# Patient Record
Sex: Female | Born: 1985 | Race: Black or African American | Hispanic: No | Marital: Single | Smoking: Never smoker
Health system: Southern US, Community
[De-identification: ages and names within clinical notes are randomized; demographics above are authoritative.]

## PROBLEM LIST (undated history)

## (undated) DIAGNOSIS — D649 Anemia, unspecified: Secondary | ICD-10-CM

---

## 2014-10-18 ENCOUNTER — Encounter (HOSPITAL_COMMUNITY): Payer: Self-pay | Admitting: Emergency Medicine

## 2014-10-18 ENCOUNTER — Emergency Department (HOSPITAL_COMMUNITY): Payer: Self-pay

## 2014-10-18 ENCOUNTER — Emergency Department (HOSPITAL_COMMUNITY)
Admission: EM | Admit: 2014-10-18 | Discharge: 2014-10-18 | Disposition: A | Discharge status: Home / Self Care | Payer: Self-pay | Attending: Emergency Medicine | Admitting: Emergency Medicine

## 2014-10-18 DIAGNOSIS — S0264XA Fracture of ramus of mandible, initial encounter for closed fracture: Secondary | ICD-10-CM | POA: Insufficient documentation

## 2014-10-18 DIAGNOSIS — Y9289 Other specified places as the place of occurrence of the external cause: Secondary | ICD-10-CM | POA: Insufficient documentation

## 2014-10-18 DIAGNOSIS — S02609A Fracture of mandible, unspecified, initial encounter for closed fracture: Secondary | ICD-10-CM

## 2014-10-18 DIAGNOSIS — Y998 Other external cause status: Secondary | ICD-10-CM | POA: Insufficient documentation

## 2014-10-18 DIAGNOSIS — M7981 Nontraumatic hematoma of soft tissue: Secondary | ICD-10-CM | POA: Insufficient documentation

## 2014-10-18 DIAGNOSIS — R21 Rash and other nonspecific skin eruption: Secondary | ICD-10-CM | POA: Insufficient documentation

## 2014-10-18 DIAGNOSIS — Y9389 Activity, other specified: Secondary | ICD-10-CM | POA: Insufficient documentation

## 2014-10-18 LAB — CBC
HCT: 37.4 % (ref 36.0–46.0)
Hemoglobin: 12.4 g/dL (ref 12.0–15.0)
MCH: 29.6 pg (ref 26.0–34.0)
MCHC: 33.2 g/dL (ref 30.0–36.0)
MCV: 89.3 fL (ref 78.0–100.0)
Platelets: 305 10*3/uL (ref 150–400)
RBC: 4.19 MIL/uL (ref 3.87–5.11)
RDW: 13.4 % (ref 11.5–15.5)
WBC: 6.5 10*3/uL (ref 4.0–10.5)

## 2014-10-18 MED ORDER — HYDROCODONE-ACETAMINOPHEN 5-325 MG PO TABS: 1.0000 | ORAL_TABLET | ORAL | Status: DC | PRN
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
## 2014-10-18 NOTE — Discharge Instructions (Signed)
Fractured-Jaw Meal Plan °The purpose of the fractured-jaw meal plan is to provide foods that can be easily blended and easily swallowed. This plan is typically used after jaw or mouth surgery, wired jaw surgery, or dental surgery. °Foods in this plan need to be blended so that they can be sipped from a straw or given through a syringe. You should try to have at least three meals and three snacks daily. It is important to make sure you get enough calories and protein to prevent weight loss and help your body heal, especially after surgery. You may wish to include a liquid multivitamin in your plan to ensure that you get all the vitamins and minerals you need. Ask your health care provider for a recommendation.  °HOW DO I PREPARE MY MEALS? °All foods in this plan must be blended. Avoid nuts, seeds, skins, peels, bones, or any foods that cannot be blended to the right consistency. Make sure to eat a variety of foods from each food group every day. The following tips can help you as you blend your food: °· Remove skins, seeds, and peels from food. °· Cook meats and vegetables thoroughly. °· Cut foods into small pieces and mix with a small amount of liquid in a food processor or blender. Continue to add liquid until the food becomes thin enough to sip through a straw. °· Adding liquids such as juice, milk, cream, broth, gravy, or vegetable juice can help add flavor to foods. °· Heat foods after they have been blended to reduce the amount of foam created from blending. °· Heat or cool your foods to lukewarm temperatures if your teeth and mouth are sensitive to extreme temperatures. °WHAT FOODS CAN I EAT? °Make sure to eat a variety of foods from each food group.  °Grains °· Hot cereals, such as oatmeal, grits, ground wheat cereals, and polenta. °· Rice and pasta. °· Couscous. °Vegetables °· All cooked or canned vegetables, without seeds and skins. °· Vegetable juices. °· Cooked potatoes, without skins. °Fruit °· Any  cooked or canned fruits, without seeds and skins. °· Fresh, peeled soft fruits, such as bananas and peaches, that can be blended until smooth. °· All fruit juices, without seeds and skins. °Meat and Other Protein Sources °· Soft-boiled eggs, scrambled eggs, powdered eggs, pasteurized egg mixtures, and custard. °· Ground meats, such as hamburger, turkey, sausage, and meatloaf. °· Tender, well-cooked meat, poultry, and fish prepared without bones or skin. °· Soft soy foods (such as tofu). °· Smooth nut butters. °Dairy °· All are allowed. °Beverages °· Coffee (regular or decaffeinated), tea, and mineral water. °Condiments °· All seasonings and condiments that blend well. °WHEN MAY I NEED TO SUPPLEMENT MY MEALS? °If you begin to lose weight on this plan, you may need to increase the amount of food you are eating or the number of calories in your food or both. You can increase the number of calories by adding any of the following foods: °· Protein powder or powdered milk. °· Extra fats, such as margarine (without trans fat), sour cream, cream cheese, cream, and nut butters, such as peanut butter or almond butter. °· Sweets, such as honey, ice cream, blackstrap molasses, or sugar. °Document Released: 12/23/2009 Document Revised: 11/19/2013 Document Reviewed: 06/01/2013 °ExitCare® Patient Information ©2015 [employer]. This information is not intended to replace advice given to you by your health care provider. Make sure you discuss any questions you have with your health care provider. ° °

## 2015-10-02 ENCOUNTER — Emergency Department (HOSPITAL_COMMUNITY)
Admission: EM | Admit: 2015-10-02 | Discharge: 2015-10-02 | Disposition: A | Discharge status: Home / Self Care | Payer: Self-pay | Attending: Emergency Medicine | Admitting: Emergency Medicine

## 2015-10-02 ENCOUNTER — Encounter (HOSPITAL_COMMUNITY): Payer: Self-pay | Admitting: Emergency Medicine

## 2015-10-02 DIAGNOSIS — Y9289 Other specified places as the place of occurrence of the external cause: Secondary | ICD-10-CM | POA: Insufficient documentation

## 2015-10-02 DIAGNOSIS — Z23 Encounter for immunization: Secondary | ICD-10-CM | POA: Insufficient documentation

## 2015-10-02 DIAGNOSIS — S61011A Laceration without foreign body of right thumb without damage to nail, initial encounter: Secondary | ICD-10-CM | POA: Insufficient documentation

## 2015-10-02 DIAGNOSIS — Y998 Other external cause status: Secondary | ICD-10-CM | POA: Insufficient documentation

## 2015-10-02 DIAGNOSIS — Y9389 Activity, other specified: Secondary | ICD-10-CM | POA: Insufficient documentation

## 2015-10-02 DIAGNOSIS — W268XXA Contact with other sharp object(s), not elsewhere classified, initial encounter: Secondary | ICD-10-CM | POA: Insufficient documentation

## 2015-10-02 MED ORDER — LIDOCAINE HCL 2 % IJ SOLN
10.0000 mL | Freq: Once | INTRAMUSCULAR | Status: AC
  Administered 2015-10-02: 400 mg via INTRADERMAL
  Filled 2015-10-02: qty 20

## 2015-10-02 MED ORDER — SODIUM BICARBONATE 4 % IV SOLN
5.0000 mL | Freq: Once | INTRAVENOUS | Status: AC
  Administered 2015-10-02: 5 mL via INTRAVENOUS
  Filled 2015-10-02: qty 5

## 2015-10-02 MED ORDER — TETANUS-DIPHTH-ACELL PERTUSSIS 5-2.5-18.5 LF-MCG/0.5 IM SUSP
0.5000 mL | Freq: Once | INTRAMUSCULAR | Status: AC
  Administered 2015-10-02: 0.5 mL via INTRAMUSCULAR
  Filled 2015-10-02: qty 0.5

## 2015-10-02 MED ORDER — IBUPROFEN 200 MG PO TABS
400.0000 mg | ORAL_TABLET | Freq: Once | ORAL | Status: AC
  Administered 2015-10-02: 400 mg via ORAL
  Filled 2015-10-02: qty 2

## 2015-10-02 NOTE — Discharge Instructions (Signed)
Bacitracin twice a day. Ibuprofen or tylenol for pain. Keep clean. Wash with soap and water. Suture removal in 7 days. Follow up with primary care doctor or urgent care.    Laceration Care, Adult A laceration is a cut that goes through all of the layers of the skin and into the tissue that is right under the skin. Some lacerations heal on their own. Others need to be closed with stitches (sutures), staples, skin adhesive strips, or skin glue. Proper laceration care minimizes the risk of infection and helps the laceration to heal better. HOW TO CARE FOR YOUR LACERATION If sutures or staples were used:  Keep the wound clean and dry.  If you were given a bandage (dressing), you should change it at least one time per day or as told by your health care provider. You should also change it if it becomes wet or dirty.  Keep the wound completely dry for the first 24 hours or as told by your health care provider. After that time, you may shower or bathe. However, make sure that the wound is not soaked in water until after the sutures or staples have been removed.  Clean the wound one time each day or as told by your health care provider:  Wash the wound with soap and water.  Rinse the wound with water to remove all soap.  Pat the wound dry with a clean towel. Do not rub the wound.  After cleaning the wound, apply a thin layer of antibiotic ointmentas told by your health care provider. This will help to prevent infection and keep the dressing from sticking to the wound.  Have the sutures or staples removed as told by your health care provider. If skin adhesive strips were used:  Keep the wound clean and dry.  If you were given a bandage (dressing), you should change it at least one time per day or as told by your health care provider. You should also change it if it becomes dirty or wet.  Do not get the skin adhesive strips wet. You may shower or bathe, but be careful to keep the wound  dry.  If the wound gets wet, pat it dry with a clean towel. Do not rub the wound.  Skin adhesive strips fall off on their own. You may trim the strips as the wound heals. Do not remove skin adhesive strips that are still stuck to the wound. They will fall off in time. If skin glue was used:  Try to keep the wound dry, but you may briefly wet it in the shower or bath. Do not soak the wound in water, such as by swimming.  After you have showered or bathed, gently pat the wound dry with a clean towel. Do not rub the wound.  Do not do any activities that will make you sweat heavily until the skin glue has fallen off on its own.  Do not apply liquid, cream, or ointment medicine to the wound while the skin glue is in place. Using those may loosen the film before the wound has healed.  If you were given a bandage (dressing), you should change it at least one time per day or as told by your health care provider. You should also change it if it becomes dirty or wet.  If a dressing is placed over the wound, be careful not to apply tape directly over the skin glue. Doing that may cause the glue to be pulled off before the wound  has healed.  Do not pick at the glue. The skin glue usually remains in place for 5-10 days, then it falls off of the skin. General Instructions  Take over-the-counter and prescription medicines only as told by your health care provider.  If you were prescribed an antibiotic medicine or ointment, take or apply it as told by your doctor. Do not stop using it even if your condition improves.  To help prevent scarring, make sure to cover your wound with sunscreen whenever you are outside after stitches are removed, after adhesive strips are removed, or when glue remains in place and the wound is healed. Make sure to wear a sunscreen of at least 30 SPF.  Do not scratch or pick at the wound.  Keep all follow-up visits as told by your health care provider. This is  important.  Check your wound every day for signs of infection. Watch for:  Redness, swelling, or pain.  Fluid, blood, or pus.  Raise (elevate) the injured area above the level of your heart while you are sitting or lying down, if possible. SEEK MEDICAL CARE IF:  You received a tetanus shot and you have swelling, severe pain, redness, or bleeding at the injection site.  You have a fever.  A wound that was closed breaks open.  You notice a bad smell coming from your wound or your dressing.  You notice something coming out of the wound, such as wood or glass.  Your pain is not controlled with medicine.  You have increased redness, swelling, or pain at the site of your wound.  You have fluid, blood, or pus coming from your wound.  You notice a change in the color of your skin near your wound.  You need to change the dressing frequently due to fluid, blood, or pus draining from the wound.  You develop a new rash.  You develop numbness around the wound. SEEK IMMEDIATE MEDICAL CARE IF:  You develop severe swelling around the wound.  Your pain suddenly increases and is severe.  You develop painful lumps near the wound or on skin that is anywhere on your body.  You have a red streak going away from your wound.  The wound is on your hand or foot and you cannot properly move a finger or toe.  The wound is on your hand or foot and you notice that your fingers or toes look pale or bluish.   This information is not intended to replace advice given to you by your health care provider. Make sure you discuss any questions you have with your health care provider.   Document Released: 07/05/2005 Document Revised: 11/19/2014 Document Reviewed: 07/01/2014 Elsevier Interactive Patient Education [employer]

## 2015-10-02 NOTE — ED Notes (Signed)
Patient presents with 1/2" laceration to tip of right thumb, reports she cut it trying to cut potatoes, appears to be piece of nailbed in laceration. Minimal swelling noted, no obvious deformity.

## 2015-10-02 NOTE — ED Provider Notes (Signed)
History  By signing my name below, I, Karle Plumber, attest that this documentation has been prepared under the direction and in the presence of Enya Bureau, PA-C. Electronically Signed: Karle Plumber, ED Scribe. 10/02/2015. 11:14 PM.  Chief Complaint  Patient presents with  . Laceration   The history is provided by the patient and medical records. No language interpreter was used.    HPI Comments:  Lindsey Zamora is a 30 y.o. female who presents to the Emergency Department complaining of a laceration to the right distal thumb that occurred PTA. Pt states she was using a potato cutting device without the guard. She reports moderate pain and associated bleeding that is now controlled. She denies modifying factors. She denies numbness, tingling or weakness of the right thumb or hand, fever, chills, nausea or vomiting. She states her tetanus vaccination is not UTD.  History reviewed. No pertinent past medical history. History reviewed. No pertinent past surgical history. No family history on file. Social History  Substance Use Topics  . Smoking status: Never Smoker   . Smokeless tobacco: None  . Alcohol Use: No   OB History    No data available     Review of Systems  Constitutional: Negative for fever and chills.  Skin: Positive for wound.  Neurological: Negative for weakness and numbness.    Allergies  Review of patient's allergies indicates no known allergies.  Home Medications   Prior to Admission medications   Medication Sig Start Date End Date Taking? Authorizing Provider  HYDROcodone-acetaminophen (NORCO/VICODIN) 5-325 MG per tablet Take 1-2 tablets by mouth every 4 (four) hours as needed. 10/18/14   Elpidio Anis, PA-C   Triage Vitals: BP 128/96 mmHg  Pulse 68  Temp(Src) 98 F (36.7 C) (Oral)  Resp 15  Ht  (1.6 m)  Wt 173 lb (78.472 kg)  BMI 30.65 kg/m2  SpO2 100%  LMP 09/11/2015 Physical Exam  Constitutional: She is oriented to person, place,  and time. She appears well-developed and well-nourished.  HENT:  Head: Normocephalic and atraumatic.  Eyes: EOM are normal.  Neck: Normal range of motion.  Cardiovascular: Normal rate.   Pulmonary/Chest: Effort normal.  Musculoskeletal: Normal range of motion.  1 cm flap laceration to the right distal tip of the thumb. Less than 1 mm of the fingernail cut off of the tip as well. No nailbed involvement. Full range of motion all joints. Laceration is superficial.  Neurological: She is alert and oriented to person, place, and time.  Skin: Skin is warm and dry.  Psychiatric: She has a normal mood and affect. Her behavior is normal.  Nursing note and vitals reviewed.   ED Course  Procedures (including critical care time) DIAGNOSTIC STUDIES: Oxygen Saturation is 100% on RA, normal by my interpretation.   COORDINATION OF CARE: 10:33 PM- Will suture laceration. Pt verbalizes understanding and agrees to plan.  NERVE BLOCK Performed by: Jaynie Crumble A Consent: Verbal consent obtained. Required items: required blood products, implants, devices, and special equipment available Time out: Immediately prior to procedure a "time out" was called to verify the correct patient, procedure, equipment, support staff and site/side marked as required.  Indication: laceration of finger Nerve block body site: right thumb  Preparation: Patient was prepped and draped in the usual sterile fashion. Needle gauge: 24 G Location technique: anatomical landmarks  Local anesthetic: lidocaine wo epi 2%   Anesthetic total: 3 ml  Outcome: pain improved Patient tolerance: Patient tolerated the procedure well with no immediate complications.  LACERATION REPAIR PROCEDURE NOTE The patient's identification was confirmed and consent was obtained. This procedure was performed by Jaynie Crumbleatyana Nitzia Perren, PA-C at 10:58 PM. Site: right distal thumb Sterile procedures observed Anesthetic used (type and amt):  Lidocaine 2% without Epinephrine digital block Suture type/size: 5-0 Prolene Length: 1 cm # of Sutures: 3 Technique: simple, interrupted Complexity: simple Antibx ointment applied Tetanus ordered Site anesthetized, irrigated with NS, explored without evidence of foreign body, wound well approximated, site covered with dry, sterile dressing.  Patient tolerated procedure well without complications. Instructions for care discussed verbally and patient provided with additional written instructions for homecare and f/u.  Medications  ibuprofen (ADVIL,MOTRIN) tablet 400 mg (400 mg Oral Given 10/02/15 2148)  lidocaine (XYLOCAINE) 2 % (with pres) injection 200 mg (400 mg Intradermal Given 10/02/15 2241)  sodium bicarbonate (NEUT) 4 % injection 5 mL (5 mLs Intravenous Given 10/02/15 2250)    MDM   Final diagnoses:  Laceration of right thumb, initial encounter   Patient with a small flap laceration to the distal right thumb. Laceration repaired with sutures. Tetanus updated. Laceration is superficial, doubt any bony involvement. There is no nailbed laceration. Home with wound care, and present Tylenol for pain, follow-up and suture removal.  Filed Vitals:   10/02/15 2132  BP: 128/96  Pulse: 68  Temp: 98 F (36.7 C)  TempSrc: Oral  Resp: 15  Height: 5\' 3"  (1.6 m)  Weight: 78.472 kg  SpO2: 100%    I personally performed the services described in this documentation, which was scribed in my presence. The recorded information has been reviewed and is accurate.   Jaynie Crumbleatyana Janell Keeling, PA-C 10/02/15 2333  Mancel BaleElliott Wentz, MD 10/03/15 0010

## 2017-05-25 DIAGNOSIS — J209 Acute bronchitis, unspecified: Secondary | ICD-10-CM | POA: Diagnosis not present

## 2017-05-25 DIAGNOSIS — R062 Wheezing: Secondary | ICD-10-CM | POA: Diagnosis not present

## 2017-07-16 ENCOUNTER — Other Ambulatory Visit: Payer: Self-pay

## 2017-07-16 ENCOUNTER — Emergency Department (HOSPITAL_COMMUNITY): Payer: BLUE CROSS/BLUE SHIELD

## 2017-07-16 ENCOUNTER — Emergency Department (HOSPITAL_COMMUNITY)
Admission: EM | Admit: 2017-07-16 | Discharge: 2017-07-16 | Disposition: A | Discharge status: Home / Self Care | Payer: BLUE CROSS/BLUE SHIELD | Attending: Emergency Medicine | Admitting: Emergency Medicine

## 2017-07-16 DIAGNOSIS — N83201 Unspecified ovarian cyst, right side: Secondary | ICD-10-CM | POA: Diagnosis not present

## 2017-07-16 DIAGNOSIS — B9689 Other specified bacterial agents as the cause of diseases classified elsewhere: Secondary | ICD-10-CM | POA: Diagnosis not present

## 2017-07-16 DIAGNOSIS — N898 Other specified noninflammatory disorders of vagina: Secondary | ICD-10-CM | POA: Diagnosis not present

## 2017-07-16 DIAGNOSIS — R102 Pelvic and perineal pain: Secondary | ICD-10-CM | POA: Insufficient documentation

## 2017-07-16 DIAGNOSIS — R109 Unspecified abdominal pain: Secondary | ICD-10-CM | POA: Diagnosis not present

## 2017-07-16 DIAGNOSIS — Z79899 Other long term (current) drug therapy: Secondary | ICD-10-CM | POA: Diagnosis not present

## 2017-07-16 DIAGNOSIS — N760 Acute vaginitis: Secondary | ICD-10-CM

## 2017-07-16 LAB — BASIC METABOLIC PANEL
Anion gap: 6 (ref 5–15)
BUN: 10 mg/dL (ref 6–20)
CHLORIDE: 107 mmol/L (ref 101–111)
CO2: 24 mmol/L (ref 22–32)
Calcium: 8.7 mg/dL — ABNORMAL LOW (ref 8.9–10.3)
Creatinine, Ser: 0.85 mg/dL (ref 0.44–1.00)
GFR calc non Af Amer: >60 mL/min (ref >60)
Glucose, Bld: 83 mg/dL (ref 65–99)
POTASSIUM: 3.6 mmol/L (ref 3.5–5.1)
Sodium: 137 mmol/L (ref 135–145)

## 2017-07-16 LAB — URINALYSIS, ROUTINE W REFLEX MICROSCOPIC
Bilirubin Urine: NEGATIVE
Glucose, UA: NEGATIVE mg/dL
KETONES UR: NEGATIVE mg/dL
Leukocytes, UA: NEGATIVE
Nitrite: NEGATIVE
Protein, ur: NEGATIVE mg/dL
Specific Gravity, Urine: 1.025 (ref 1.005–1.030)
pH: 5 (ref 5.0–8.0)

## 2017-07-16 LAB — CBC WITH DIFFERENTIAL/PLATELET
Basophils Absolute: 0 10*3/uL (ref 0.0–0.1)
Basophils Relative: 1 %
EOS ABS: 0.2 10*3/uL (ref 0.0–0.7)
Eosinophils Relative: 4 %
HCT: 33.6 % — ABNORMAL LOW (ref 36.0–46.0)
HEMOGLOBIN: 11 g/dL — AB (ref 12.0–15.0)
Lymphocytes Relative: 39 %
Lymphs Abs: 2.5 10*3/uL (ref 0.7–4.0)
MCH: 27.4 pg (ref 26.0–34.0)
MCHC: 32.7 g/dL (ref 30.0–36.0)
MCV: 83.8 fL (ref 78.0–100.0)
Monocytes Absolute: 0.5 10*3/uL (ref 0.1–1.0)
Monocytes Relative: 8 %
Neutro Abs: 3.2 10*3/uL (ref 1.7–7.7)
Neutrophils Relative %: 48 %
Platelets: 393 10*3/uL (ref 150–400)
RBC: 4.01 MIL/uL (ref 3.87–5.11)
RDW: 14.6 % (ref 11.5–15.5)
WBC: 6.5 10*3/uL (ref 4.0–10.5)

## 2017-07-16 LAB — WET PREP, GENITAL
SPERM: NONE SEEN
TRICH WET PREP: NONE SEEN
YEAST WET PREP: NONE SEEN

## 2017-07-16 LAB — POC URINE PREG, ED: Preg Test, Ur: NEGATIVE

## 2017-07-16 MED ORDER — MELOXICAM 15 MG PO TABS: 15.0000 mg | ORAL_TABLET | Freq: Every day | ORAL | 0 refills | Status: DC

## 2017-07-16 MED ORDER — KETOROLAC TROMETHAMINE 60 MG/2ML IM SOLN
30.0000 mg | Freq: Once | INTRAMUSCULAR | Status: DC
  Filled 2017-07-16: qty 2

## 2017-07-16 MED ORDER — METRONIDAZOLE 500 MG PO TABS: 500.0000 mg | ORAL_TABLET | Freq: Two times a day (BID) | ORAL | 0 refills | Status: DC

## 2017-07-16 NOTE — ED Notes (Signed)
Pt very angry at being here as long as she has. Pt states "Somebody needs to figure something out right now. I need to know what is going on. There is no reason I should still be here." RN and PA have explained delay to pt, pt still angry.

## 2017-07-16 NOTE — ED Triage Notes (Signed)
Pt reports pain in her lower stomach and running down her legs. Pt reports some pain in her lower back but the pain in both legs is the same and it runs down to her knees.  Pt denies any hx of sciatica.  Pt reports that the pain starts in the morning and has to take ibuprofen for some pain relief. Pt reports the pain when she lays down the pain lightens. Pt works [employer] and reports she walks a lot.

## 2017-07-16 NOTE — ED Notes (Signed)
RN AT BEDSIDE COLLECTING LABS 

## 2017-07-16 NOTE — Discharge Instructions (Addendum)
DO NOT DRINK ALCOHOL WITH THIS MEDICATION AS IT CAN MAKE YOU VIOLENTLY ILL.!  YOUR STD STUDIES ARE PENDING.

## 2017-07-16 NOTE — ED Notes (Signed)
D/c instructions reviewed with patient. Pt did not sign d/c paperwork because she was understandably in a hurry to leave.

## 2017-07-16 NOTE — ED Provider Notes (Signed)
[facility]-EMERGENCY DEPT Provider Note   CSN: [id number] Arrival date & time: 07/16/17  1035     History   Chief Complaint No chief complaint on file.   HPI Lindsey Zamora is a 31 y.o. female who presents to the ED with abdominal pain that radiates to her legs. The pain started over a month ago and has gotten worse. The pain is located in the lower abdomen and today worse on the left lower abdomen. Patient describes the pain as cramping and like contractions. She has taken tylenol and ibuprofen that help for a short time but then the pain returns. Patient reports watery vaginal d/c. Last sexual intercourse 1 month ago. Patient does not use birth control.   The history is provided by the patient. No language interpreter was used.  Abdominal Pain   This is a new problem. The current episode started more than 1 week ago. The problem occurs constantly. The problem has been gradually worsening. The pain is located in the LLQ. The quality of the pain is cramping. The pain is at a severity of 10/10. Associated symptoms include headaches and myalgias. Pertinent negatives include fever, diarrhea, nausea, vomiting, constipation, dysuria and frequency. Nothing aggravates the symptoms. Nothing relieves the symptoms.    No past medical history on file.  There are no active problems to display for this patient.   No past surgical history on file.  OB History    No data available       Home Medications    Prior to Admission medications   Medication Sig Start Date End Date Taking? Authorizing Provider  acetaminophen (TYLENOL) 500 MG tablet Take 1,000 mg by mouth every 6 (six) hours as needed for mild pain.   Yes [provider]  ibuprofen (ADVIL,MOTRIN) 400 MG tablet Take 400 mg by mouth every 6 (six) hours as needed for headache.   Yes [provider]  HYDROcodone-acetaminophen (NORCO/VICODIN) 5-325 MG per tablet Take 1-2 tablets by mouth every 4 (four)  hours as needed. Patient not taking: Reported on 07/16/2017 10/18/14   Elpidio AnisUpstill, Shari, PA-C    Family History No family history on file.  Social History Social History   Tobacco Use  . Smoking status: Never Smoker  Substance Use Topics  . Alcohol use: No  . Drug use: No     Allergies   Patient has no known allergies.   Review of Systems Review of Systems  Constitutional: Negative for fever.  HENT: Negative.   Eyes: Negative for discharge, redness and itching.  Respiratory: Negative for cough and shortness of breath.   Cardiovascular: Negative for chest pain.  Gastrointestinal: Positive for abdominal pain. Negative for constipation, diarrhea, nausea and vomiting.  Genitourinary: Positive for pelvic pain and vaginal discharge. Negative for difficulty urinating, dysuria, frequency and urgency.  Musculoskeletal: Positive for back pain and myalgias.  Skin: Negative for rash.  Neurological: Positive for headaches. Negative for syncope.  Psychiatric/Behavioral: Negative for confusion.     Physical Exam Updated Vital Signs BP (!) 153/98 (BP Location: Left Arm)   Pulse 73   Temp 98.5 F (36.9 C) (Oral)   Resp 18   Ht 5\' 1"  (1.549 m)   Wt 73.5 kg (162 lb)   LMP 07/02/2017 (Exact Date)   SpO2 100%   BMI 30.61 kg/m   Physical Exam  Constitutional: She appears well-developed and well-nourished. No distress.  HENT:  Head: Normocephalic and atraumatic.  Right Ear: Tympanic membrane normal.  Left Ear: Tympanic membrane  normal.  Nose: Nose normal.  Mouth/Throat: Uvula is midline, oropharynx is clear and moist and mucous membranes are normal.  Eyes: EOM are normal.  Neck: Neck supple.  Cardiovascular: Normal rate and regular rhythm.  Pulmonary/Chest: Effort normal.  Abdominal: Soft. There is tenderness.  Genitourinary:  Genitourinary Comments: External genitalia without lesions, watery d/c vaginal vault. Positive CMT, bilateral adnexal tenderness, uterus without palpable  enlargement.  Musculoskeletal: Normal range of motion. She exhibits no edema.  No calf tenderness  Neurological: She is alert.  Skin: Skin is warm and dry.  Psychiatric: She has a normal mood and affect. Her behavior is normal.  Nursing note and vitals reviewed.    ED Treatments / Results  Labs (all labs ordered are listed, but only abnormal results are displayed) Labs Reviewed  WET PREP, GENITAL - Abnormal; Notable for the following components:      Result Value   Clue Cells Wet Prep HPF POC PRESENT (*)    WBC, Wet Prep HPF POC MANY (*)    All other components within normal limits  URINALYSIS, ROUTINE W REFLEX MICROSCOPIC - Abnormal; Notable for the following components:   Hgb urine dipstick SMALL (*)    Bacteria, UA RARE (*)    Squamous Epithelial / LPF 0-5 (*)    All other components within normal limits  CBC WITH DIFFERENTIAL/PLATELET - Abnormal; Notable for the following components:   Hemoglobin 11.0 (*)    HCT 33.6 (*)    All other components within normal limits  BASIC METABOLIC PANEL - Abnormal; Notable for the following components:   Calcium 8.7 (*)    All other components within normal limits  RPR  HIV ANTIBODY (ROUTINE TESTING)  POC URINE PREG, ED  GC/CHLAMYDIA PROBE AMP ([place]) NOT AT [facility]  Radiology No results found.  Procedures Procedures (including critical care time)  Medications Ordered in ED Medications  ketorolac (TORADOL) injection 30 mg (30 mg Intramuscular Refused 07/16/17 1625)     Initial Impression / Assessment and Plan / ED Course  I have reviewed the triage vital signs and the nursing notes. Patient awaiting ultrasound. Care turned over to A. Tiburcio PeaHarris, [facility] @ 4:54pm  Final Clinical Impressions(s) / ED Diagnoses   Final diagnoses:  Pelvic pain  Vaginal discharge    ED Discharge Orders    None       Kerrie Buffaloeese, Xsavier Seeley [place], [place] 07/16/17 1654    Pricilla LovelessGoldston, Scott, MD 07/16/17 1700

## 2017-07-16 NOTE — ED Provider Notes (Signed)
Patient with extensive wait in the ER.  Her ultrasound was mislabeled and I spent about 30 minutes and 4 phone calls to radiology to get this cleared up. Meanwhile the patient became irate, understandably. I tried to explain the situation, but was unable to calm her.  She appears to have an ovarian cyst + BV. Will treat for b/v, give antiinflammatories. Her std tests are pending.   Arthor CaptainHarris, Monque Haggar, PA-C 07/16/17 2306    Linwood DibblesKnapp, Jon, MD 07/16/17 [phone]

## 2017-07-17 LAB — HIV ANTIBODY (ROUTINE TESTING W REFLEX): HIV Screen 4th Generation wRfx: NONREACTIVE

## 2017-07-17 LAB — RPR: RPR Ser Ql: NONREACTIVE

## 2017-07-18 LAB — GC/CHLAMYDIA PROBE AMP (~~LOC~~) NOT AT ARMC
CHLAMYDIA, DNA PROBE: NEGATIVE
Chlamydia: NEGATIVE
NEISSERIA GONORRHEA: NEGATIVE
Neisseria Gonorrhea: NEGATIVE

## 2017-08-16 ENCOUNTER — Encounter: Payer: Self-pay | Admitting: Obstetrics and Gynecology

## 2017-08-16 ENCOUNTER — Encounter: Payer: Self-pay | Admitting: Advanced Practice Midwife

## 2017-08-16 ENCOUNTER — Ambulatory Visit (INDEPENDENT_AMBULATORY_CARE_PROVIDER_SITE_OTHER): Payer: BLUE CROSS/BLUE SHIELD | Admitting: Advanced Practice Midwife

## 2017-08-16 VITALS — BP 130/83 | HR 79 | Ht 61.0 in | Wt 167.7 lb

## 2017-08-16 DIAGNOSIS — N8003 Adenomyosis of the uterus: Secondary | ICD-10-CM

## 2017-08-16 DIAGNOSIS — R102 Pelvic and perineal pain: Secondary | ICD-10-CM | POA: Diagnosis not present

## 2017-08-16 DIAGNOSIS — N800 Endometriosis of uterus: Secondary | ICD-10-CM | POA: Diagnosis not present

## 2017-08-16 DIAGNOSIS — Z113 Encounter for screening for infections with a predominantly sexual mode of transmission: Secondary | ICD-10-CM | POA: Diagnosis not present

## 2017-08-16 DIAGNOSIS — N809 Endometriosis, unspecified: Secondary | ICD-10-CM

## 2017-08-16 NOTE — Progress Notes (Signed)
Subjective:     Patient ID: Lindsey Zamora, female   DOB: [birth year], 32 y.o.   MRN: [id number]  Lindsey Zamora is a 32 y.o. G2P2 who presents today as FU from the ED. She was seen there with pain, and had US done. She did not get the [place] results at the time. She was treated for BV in the ED, and she is also having discharge still. She is unsure if she still has BV.   Gynecologic Exam  The patient's primary symptoms include pelvic pain and vaginal discharge. This is a new problem. The current episode started 1 to 4 weeks ago. The problem occurs intermittently (usually worse the week after menses. ). The problem has been unchanged. The problem affects both sides. She is not pregnant. The vaginal discharge was normal. There has been no bleeding. Exacerbated by: menses. She has tried acetaminophen for the symptoms. The treatment provided no relief. She is sexually active. Her menstrual history has been regular (LMP 08/01/16).     Review of Systems  Genitourinary: Positive for pelvic pain and vaginal discharge.   EXAM: TRANSABDOMINAL AND TRANSVAGINAL ULTRASOUND OF PELVIS  DOPPLER ULTRASOUND OF OVARIES  TECHNIQUE: Both transabdominal and transvaginal ultrasound examinations of the pelvis were performed. Transabdominal technique was performed for global imaging of the pelvis including uterus, ovaries, adnexal regions, and pelvic cul-de-sac.  It was necessary to proceed with endovaginal exam following the transabdominal exam to visualize the endometrium and adnexa. Color and duplex Doppler ultrasound was utilized to evaluate blood flow to the ovaries.  COMPARISON:  None.  FINDINGS: Uterus  Measurements: 8.5 x 5.1 x 6.2 cm. Retroverted uterus. Uterus is mildly enlarged. There is an intramural 3.1 x 2.8 x 3.1 cm left fundal fibroid. No additional fibroids. The uterus is globular in conformation and the myometrium appears heterogeneous with scattered refractory shadowing, suggesting diffuse  uterine adenomyosis.  Endometrium  Thickness: 9 mm. No endometrial cavity fluid or focal endometrial mass.  Right ovary  Measurements: 3.2 x 2.1 x 2.2 cm. Simple 1.8 x 1.8 x 1.7 cm right ovarian cyst. No abnormal right ovarian or right adnexal masses.  Left ovary  Measurements: 2.1 x 1.5 x 2.0 cm. Normal appearance/no adnexal mass.  Pulsed Doppler evaluation of both ovaries demonstrates normal low-resistance arterial and venous waveforms.  Other findings  No abnormal free fluid.  IMPRESSION: 1. No evidence of adnexal torsion. 2. Simple 1.8 cm right ovarian cyst, considered physiologic. No suspicious ovarian or adnexal masses. 3. Solitary 3.1 cm left fundal intramural fibroid. 4. Sonographic findings suggestive diffuse uterine adenomyosis.  These results were called by telephone at the time of interpretation on 07/16/2017 at 7:51 pm to Arthor Captain, PA in the ED, who verbally acknowledged these results. Scan interpretation was delayed due to IT issues, which was communicated to the referring provider.   Electronically Signed   By: Delbert Phenix M.D.   On: 07/16/2017 20:30  Objective:   Physical Exam  Constitutional: She is oriented to person, place, and time. She appears well-developed and well-nourished. No distress.  HENT:  Head: Normocephalic.  Cardiovascular: Normal rate.  Pulmonary/Chest: Effort normal.  Abdominal: Soft. There is no tenderness. There is no rebound.  Neurological: She is alert and oriented to person, place, and time.  Skin: Skin is warm and dry.  Psychiatric: She has a normal mood and affect.  Nursing note and vitals reviewed.      Assessment:     1. Pelvic pain   2. Adenomyosis  Plan:     Wet prep Considering IUD Will make appt if desires IUD.  Continue ibuprofen for pain

## 2017-08-16 NOTE — Patient Instructions (Addendum)

## 2017-08-17 LAB — CERVICOVAGINAL ANCILLARY ONLY
BACTERIAL VAGINITIS: POSITIVE — AB
CHLAMYDIA, DNA PROBE: NEGATIVE
Candida vaginitis: NEGATIVE
Neisseria Gonorrhea: NEGATIVE
TRICH (WINDOWPATH): NEGATIVE

## 2017-08-19 ENCOUNTER — Telehealth: Payer: Self-pay | Admitting: *Deleted

## 2017-08-19 ENCOUNTER — Other Ambulatory Visit: Payer: Self-pay | Admitting: Advanced Practice Midwife

## 2017-08-19 MED ORDER — METRONIDAZOLE 500 MG PO TABS: 500.0000 mg | ORAL_TABLET | Freq: Two times a day (BID) | ORAL | 0 refills | Status: DC

## 2017-08-19 NOTE — Telephone Encounter (Addendum)
Called pt and left message stating that I am calling with test result information and recommendations. Please call back and leave a message stating whether a detailed message can be left on her voicemail. Pt needs to be informed of +BV and Rx has been sent to her pharmacy.   2/1  1550  Pt returned my call and she was informed of test results and treatment prescribed. Pt had multiple questions regarding the recurrence of this condition and possible causes. Pt's questions were answered to her satisfaction.

## 2017-08-19 NOTE — Telephone Encounter (Signed)
Pt returned call, states we may leave a detailed message on her voice mail.

## 2017-08-19 NOTE — Progress Notes (Signed)
f °

## 2017-09-05 ENCOUNTER — Encounter: Payer: Self-pay | Admitting: General Practice

## 2017-09-05 ENCOUNTER — Ambulatory Visit (INDEPENDENT_AMBULATORY_CARE_PROVIDER_SITE_OTHER): Payer: BLUE CROSS/BLUE SHIELD | Admitting: Advanced Practice Midwife

## 2017-09-05 ENCOUNTER — Encounter: Payer: Self-pay | Admitting: Advanced Practice Midwife

## 2017-09-05 VITALS — BP 122/88 | HR 68 | Ht 61.0 in | Wt 168.8 lb

## 2017-09-05 DIAGNOSIS — Z01419 Encounter for gynecological examination (general) (routine) without abnormal findings: Secondary | ICD-10-CM

## 2017-09-05 DIAGNOSIS — Z124 Encounter for screening for malignant neoplasm of cervix: Secondary | ICD-10-CM

## 2017-09-05 DIAGNOSIS — Z30430 Encounter for insertion of intrauterine contraceptive device: Secondary | ICD-10-CM | POA: Diagnosis not present

## 2017-09-05 DIAGNOSIS — Z1151 Encounter for screening for human papillomavirus (HPV): Secondary | ICD-10-CM

## 2017-09-05 DIAGNOSIS — Z3009 Encounter for other general counseling and advice on contraception: Secondary | ICD-10-CM

## 2017-09-05 LAB — POCT PREGNANCY, URINE: Preg Test, Ur: NEGATIVE

## 2017-09-05 MED ORDER — LEVONORGESTREL 19.5 MCG/DAY IU IUD
INTRAUTERINE_SYSTEM | Freq: Once | INTRAUTERINE | Status: AC
  Administered 2017-09-05: 10:00:00 via INTRAUTERINE

## 2017-09-05 NOTE — Patient Instructions (Signed)

## 2017-09-05 NOTE — Progress Notes (Signed)
Subjective:     Patient ID: Cristi LoronKeota Voit, female   DOB: May 05, 1986, 32 y.o.   MRN: [id number]  Cristi LoronKeota Cristobal is a 32 y.o. No obstetric history on file who is here today for pap/annual and IUD insertion. No complaints today.    Gynecologic Exam  The patient's pertinent negatives include no pelvic pain. The vaginal discharge was normal. The vaginal bleeding is spotting. Sexual activity: No intercourse since December 2018  She uses nothing for contraception. Her menstrual history has been regular (LMP 09/01/17).     Review of Systems  Genitourinary: Negative for pelvic pain.  All other systems reviewed and are negative.      Objective:   Physical Exam  Constitutional: She is oriented to person, place, and time. She appears well-developed and well-nourished. No distress.  HENT:  Head: Normocephalic.  Cardiovascular: Normal rate.  Pulmonary/Chest: Effort normal.  Abdominal: Soft. There is no tenderness.  Genitourinary:  Genitourinary Comments:  External: no lesion Vagina: small amount of blood seen (patient's cycle is ending) Cervix: pink, smooth, no CMT Uterus: NSSC Adnexa: NT   Neurological: She is alert and oriented to person, place, and time.  Skin: Skin is warm and dry.  Psychiatric: She has a normal mood and affect.  Nursing note and vitals reviewed.     GYNECOLOGY OFFICE PROCEDURE NOTE  Cristi LoronKeota Craney is a 32 y.o. No obstetric history on file. here for Liletta IUD insertion. No GYN concerns.  Last pap smear was today  IUD Insertion Procedure Note Patient identified, informed consent performed, consent signed.   Discussed risks of irregular bleeding, cramping, infection, malpositioning or misplacement of the IUD outside the uterus which may require further procedure such as laparoscopy. Time out was performed.  Urine pregnancy test negative.  Speculum placed in the vagina.  Cervix visualized.  Cleaned with Betadine x 2.  Grasped anteriorly with a tenaculum.  Uterus sounded to 8  cm. Unable to easily place IUD. Dr. Marice Potterove called to the room for assistance. New IUD obtained and Dr. Marice Potterove placed the [place] per manufacturer's recommendations.  Strings trimmed to 3 cm. Tenaculum was removed, good hemostasis noted.  Patient tolerated procedure well.   Patient was given post-procedure instructions.  She was advised to have backup contraception for one week.  Patient was also asked to check IUD strings periodically and follow up in 4 weeks for IUD check.   Assessment:     1. Well woman exam with routine gynecological exam   2. Encounter for IUD insertion   3. General counselling and advice on contraception        Plan:      Post IUD care discussed Return precautions reviewed FU in 4 weeks for IUD check.

## 2017-09-07 LAB — CYTOLOGY - PAP
Diagnosis: NEGATIVE
HPV: NOT DETECTED

## 2017-09-09 ENCOUNTER — Encounter: Payer: Self-pay | Admitting: *Deleted

## 2017-10-03 ENCOUNTER — Ambulatory Visit (INDEPENDENT_AMBULATORY_CARE_PROVIDER_SITE_OTHER): Payer: BLUE CROSS/BLUE SHIELD | Admitting: Advanced Practice Midwife

## 2017-10-03 ENCOUNTER — Encounter: Payer: Self-pay | Admitting: Advanced Practice Midwife

## 2017-10-03 ENCOUNTER — Encounter: Payer: Self-pay | Admitting: Family Medicine

## 2017-10-03 VITALS — BP 126/89 | HR 72 | Ht 61.0 in | Wt 171.2 lb

## 2017-10-03 DIAGNOSIS — Z30431 Encounter for routine checking of intrauterine contraceptive device: Secondary | ICD-10-CM

## 2017-10-03 NOTE — Progress Notes (Signed)
GYNECOLOGY OFFICE PROGRESS NOTE  History:  32 y.o. No obstetric history on file. here today for today for IUD string check; Liletta IUD was placed  09/05/17. No complaints about the IUD. She has had some spotting, and then had a cycle that was lighter than normal. However, it has lasted longer than normal.   The following portions of the patient's history were reviewed and updated as appropriate: allergies, current medications, past family history, past medical history, past social history, past surgical history and problem list. Last pap smear on 09/05/17 was normal, negative HRHPV.  Review of Systems:  Pertinent items are noted in HPI.   Objective:  Physical Exam Blood pressure 126/89, pulse 72, height 5\' 1"  (1.549 m), weight 171 lb 3.2 oz (77.7 kg), last menstrual period 09/23/2017. CONSTITUTIONAL: Well-developed, well-nourished female in no acute distress.  HENT:  Normocephalic, atraumatic. External right and left ear normal. Oropharynx is clear and moist EYES: Conjunctivae and EOM are normal. Pupils are equal, round, and reactive to light. No scleral icterus.  NECK: Normal range of motion, supple, no masses CARDIOVASCULAR: Normal heart rate noted RESPIRATORY: Effort and breath sounds normal, no problems with respiration noted ABDOMEN: Soft, no distention noted.   PELVIC: Normal appearing external genitalia; normal appearing vaginal mucosa and cervix.  IUD strings visualized, about 3 cm in length outside cervix.   Assessment & Plan:  Normal IUD check. Discussed bleeding profile. Will continue to monitor bleeding at this time.  Patient to keep IUD in place for five years; can come in for removal if she desires pregnancy within the next five years. Routine preventative health maintenance measures emphasized.  Thressa ShellerHeather Hogan 9:42 AM 10/03/17

## 2018-01-09 ENCOUNTER — Ambulatory Visit (INDEPENDENT_AMBULATORY_CARE_PROVIDER_SITE_OTHER): Payer: BLUE CROSS/BLUE SHIELD | Admitting: Advanced Practice Midwife

## 2018-01-09 DIAGNOSIS — Z30431 Encounter for routine checking of intrauterine contraceptive device: Secondary | ICD-10-CM | POA: Diagnosis not present

## 2018-01-09 NOTE — Progress Notes (Signed)
I have reviewed the chart and agree with nursing staff's documentation of this patient's encounter.  [name], CNM 01/09/2018 4:45 PM

## 2018-03-06 ENCOUNTER — Ambulatory Visit: Payer: BLUE CROSS/BLUE SHIELD

## 2018-03-10 ENCOUNTER — Telehealth: Payer: Self-pay | Admitting: General Practice

## 2018-03-10 NOTE — Telephone Encounter (Signed)
Patient called & left message on nurse voicemail line stating she has had the liletta IUD for 7 months now and has recently started bleeding. Patient states she has been bleeding for 11 days and is passing small clots. Patient requests call back.  Called patient, no answer- left message stating we are trying to reach you to return your phone call, please call us back or send us a mychart message if you still need assistance.

## 2018-03-24 ENCOUNTER — Encounter: Payer: Self-pay | Admitting: Obstetrics and Gynecology

## 2018-03-24 ENCOUNTER — Ambulatory Visit (INDEPENDENT_AMBULATORY_CARE_PROVIDER_SITE_OTHER): Payer: BLUE CROSS/BLUE SHIELD | Admitting: Obstetrics and Gynecology

## 2018-03-24 VITALS — BP 124/87 | HR 73 | Ht 61.5 in | Wt 170.7 lb

## 2018-03-24 DIAGNOSIS — N939 Abnormal uterine and vaginal bleeding, unspecified: Secondary | ICD-10-CM | POA: Diagnosis not present

## 2018-03-24 DIAGNOSIS — Z975 Presence of (intrauterine) contraceptive device: Secondary | ICD-10-CM | POA: Diagnosis not present

## 2018-03-24 MED ORDER — NORGESTIMATE-ETH ESTRADIOL 0.25-35 MG-MCG PO TABS: 1.0000 | ORAL_TABLET | Freq: Every day | ORAL | 0 refills | Status: DC

## 2018-03-24 NOTE — Progress Notes (Signed)
Pt states her last period lasted for 15 days

## 2018-03-24 NOTE — Progress Notes (Signed)
Subjective:   Lindsey Zamora is a 32 y.o. non pregnant female here in the office today due to concerns about her IUD. She had her IUD placed on September 05, 2017 Lindsey Zamora). Initially her periods were irregular and now her periods are irregular and last longer that normal. LMP was August 17 and lasted 15 days. Says she is not able to feel her strings and wants to make sure everything is ok. Some abdominal pain that is worse on the left side. The pain on her left side just started. She currently rates her pain 2/10. She has not taken anything for the pain. She had ST[facility] which was all negative. No new sexual partners.   Gynecologic History Patient's last menstrual period was 03/03/2018 (approximate). Contraception: IUD Last Pap: 09/05/17; Results were: normal with negative HPV Last mammogram: NA  Obstetric History OB History  No data available    No past medical history on file.  No past surgical history on file.  Current Outpatient Medications on File Prior to Visit  Medication Sig Dispense Refill  . acetaminophen (TYLENOL) 500 MG tablet Take 1,000 mg by mouth every 6 (six) hours as needed for mild pain.    Marland Kitchen ibuprofen (ADVIL,MOTRIN) 400 MG tablet Take 400 mg by mouth every 6 (six) hours as needed for headache.     No current facility-administered medications on file prior to visit.     No Known Allergies  Social History:  reports that she has been smoking pipe. She has never used smokeless tobacco. She reports that she does not drink alcohol or use drugs.  No family history on file.  The following portions of the patient's history were reviewed and updated as appropriate: allergies, current medications, past family history, past medical history, past social history, past surgical history and problem list.  Review of Systems Pertinent items noted in HPI and remainder of comprehensive ROS otherwise negative.   Objective:  BP 124/87   Pulse 73   Ht  5' 1.5" (1.562 m)   Wt 170 lb 11.2 oz (77.4 kg)   LMP 03/03/2018 (Approximate)   BMI 31.73 kg/m  CONSTITUTIONAL: Well-developed, well-nourished female in no acute distress.  HENT:  Normocephalic, atraumatic, External right and left ear normal. Oropharynx is clear and moist EYES: Conjunctivae and EOM are normal. Pupils are equal, round, and reactive to light. No scleral icterus.  NECK: Normal range of motion, supple, no masses.  Normal thyroid.  SKIN: Skin is warm and dry. No rash noted. Not diaphoretic. No erythema. No pallor. MUSCULOSKELETAL: Normal range of motion. No tenderness.  No cyanosis, clubbing, or edema.  2+ distal pulses. NEUROLOGIC: Alert and oriented to person, place, and time. Normal reflexes, muscle tone coordination. No cranial nerve deficit noted. PSYCHIATRIC: Normal mood and affect. Normal behavior. Normal judgment and thought content. CARDIOVASCULAR: Normal heart rate noted, regular rhythm RESPIRATORY: Clear to auscultation bilaterally. Effort and breath sounds normal, no problems with respiration noted. BREASTS: Symmetric in size. No masses, skin changes, nipple drainage, or lymphadenopathy. ABDOMEN: Soft, normal bowel sounds, no distention noted.  No tenderness, rebound or guarding.  PELVIC: Normal appearing external genitalia; normal appearing vaginal mucosa and cervix.  No abnormal discharge noted.  Normal uterine size, no other palpable masses, no uterine or adnexal tenderness. IUD strings visualized and palpated.   Assessment and Plan:   A:  1. IUD (intrauterine device) in place  If patient continues to have pain/ abnormal vaginal bleeding, will  order outpatient pelvic US. F/U 4-6 weeks   2. Abnormal vaginal bleeding  Other orders - norgestimate-ethinyl estradiol (ORTHO-CYCLEN,SPRINTEC,PREVIFEM) 0.25-35 MG-MCG tablet; Take 1 tablet by mouth daily for one month  .  Routine preventative health maintenance measures emphasized. Please refer to After Visit  Summary for other counseling recommendations.    Lindsey Zamora, Lindsey Rutherford, NP  [facility] [employer], [facility]

## 2018-05-13 ENCOUNTER — Other Ambulatory Visit: Payer: Self-pay | Admitting: Obstetrics and Gynecology

## 2018-11-02 ENCOUNTER — Other Ambulatory Visit (HOSPITAL_COMMUNITY): Payer: Self-pay | Admitting: *Deleted

## 2018-11-02 DIAGNOSIS — N6320 Unspecified lump in the left breast, unspecified quadrant: Secondary | ICD-10-CM

## 2018-11-08 ENCOUNTER — Telehealth (HOSPITAL_COMMUNITY): Payer: Self-pay | Admitting: *Deleted

## 2018-11-08 NOTE — Telephone Encounter (Signed)
Telephoned patient at home number and confirmed [facility] appointment for April 23. No symptoms of COVID-19. No contact with someone with a confirmed diagnosis of COVID-19. No travel outside of [place] in the past 14 days.  

## 2018-11-09 ENCOUNTER — Encounter (HOSPITAL_COMMUNITY): Payer: Self-pay

## 2018-11-09 ENCOUNTER — Ambulatory Visit (HOSPITAL_COMMUNITY)
Admission: RE | Admit: 2018-11-09 | Discharge: 2018-11-09 | Disposition: A | Discharge status: Home / Self Care | Payer: BLUE CROSS/BLUE SHIELD | Source: Ambulatory Visit | Attending: Obstetrics and Gynecology | Admitting: Obstetrics and Gynecology

## 2018-11-09 ENCOUNTER — Other Ambulatory Visit: Payer: Self-pay

## 2018-11-09 VITALS — BP 110/72 | Temp 98.3°F | Ht 62.0 in | Wt 169.0 lb

## 2018-11-09 DIAGNOSIS — Z1239 Encounter for other screening for malignant neoplasm of breast: Secondary | ICD-10-CM

## 2018-11-09 DIAGNOSIS — N6321 Unspecified lump in the left breast, upper outer quadrant: Secondary | ICD-10-CM

## 2018-11-09 HISTORY — DX: Anemia, unspecified: D64.9

## 2018-11-09 NOTE — Progress Notes (Signed)
Complaints of a left breast lump for over a year that is painful. Patient states the pain comes and goes. Patient rates the pain at 3 out of 10.  Pap Smear: Pap smear not completed today. Last Pap smear was 10/13/2018 at [facility] and normal per patient. Per patient has a history of an abnormal Pap smear over 7 years ago that a colposcopy was completed for follow-up. Patient stated she has had at least three normal Pap smears since colposcopy. Last Pap smear result is not in Epic. Previous Pap smear result from 2/18/019 is in Epic.  Physical exam: Breasts Breasts symmetrical. No skin abnormalities bilateral breasts. No nipple retraction bilateral breasts. No nipple discharge bilateral breasts. No lymphadenopathy. No lumps palpated right breast. Palpated a pea sized lump within the left breast at 1 o'clock 6 cm from the nipple. Complaints of tenderness when palpated left breast lump. Referred patient to the [facility] of [facility] for a diagnostic mammogram and left breast ultrasound. Appointment scheduled for Friday, November 10, 2018 at 1520.        Pelvic/Bimanual No Pap smear completed today since last Pap smear was 10/13/2018 per patient. Pap smear not indicated per B[facility]CP guidelines.   Smoking History: Patient has never smoked.  Patient Navigation: Patient education provided. Access to services provided for patient through [facility]CP program.   Breast and Cervical Cancer Risk Assessment: Patient has a family history of her maternal grandmother having breast cancer. Patient has no known genetic mutations or history of radiation treatment to the chest before age 79. Per patient has a history of cervical dysplasia. Patient has no history of being immunocompromised or DES exposure in-utero.  Risk Assessment    Risk Scores      11/09/2018   Last edited by: Lynnell Dike, LPN   5-year risk:    Lifetime risk:

## 2018-11-09 NOTE — Patient Instructions (Signed)
Explained breast self awareness with [name]. Patient did not need a Pap smear today due to last Pap smear was 10/13/2018 per patient. Let her know [facility] will cover Pap smears every 3 years unless has a history of abnormal Pap smears. Referred patient to the [facility] for a diagnostic mammogram and left breast ultrasound. Appointment scheduled for Friday, November 10, 2018 at 1520. Patient aware of appointment and will be there. [name] verbalized understanding.  [name], [name], RN 9:26 AM

## 2018-11-10 ENCOUNTER — Other Ambulatory Visit: Payer: BLUE CROSS/BLUE SHIELD

## 2018-11-10 ENCOUNTER — Ambulatory Visit
Admission: RE | Admit: 2018-11-10 | Discharge: 2018-11-10 | Disposition: A | Discharge status: Home / Self Care | Payer: BLUE CROSS/BLUE SHIELD | Source: Ambulatory Visit | Attending: Obstetrics and Gynecology | Admitting: Obstetrics and Gynecology

## 2018-11-10 DIAGNOSIS — N6320 Unspecified lump in the left breast, unspecified quadrant: Secondary | ICD-10-CM

## 2018-12-04 ENCOUNTER — Encounter (HOSPITAL_COMMUNITY): Payer: Self-pay | Admitting: *Deleted

## 2019-11-27 IMAGING — MG DIGITAL DIAGNOSTIC BILATERAL MAMMOGRAM WITH TOMO AND CAD
6 of 10 series · 6 of 30 positions shown · non-contrast
Comparison: None.

CLINICAL DATA: Patient describes LEFT subclavicular swelling and a
palpable lump in the lateral LEFT breast (2 o'clock axis region).

This is patient's baseline mammogram.
EXAM:
DIGITAL DIAGNOSTIC BILATERAL MAMMOGRAM WITH CAD AND TOMO
ULTRASOUND LEFT BREAST

[R MLO synth-2D]
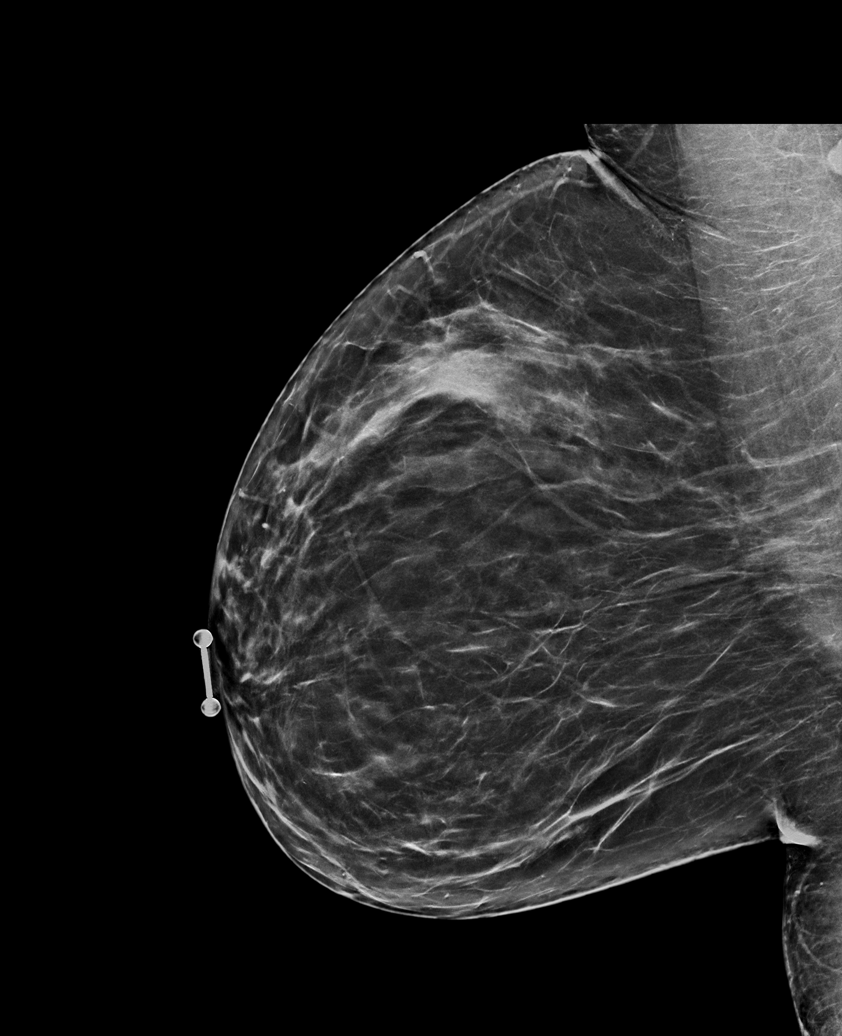

[L CC synth-2D (1 of 2)]
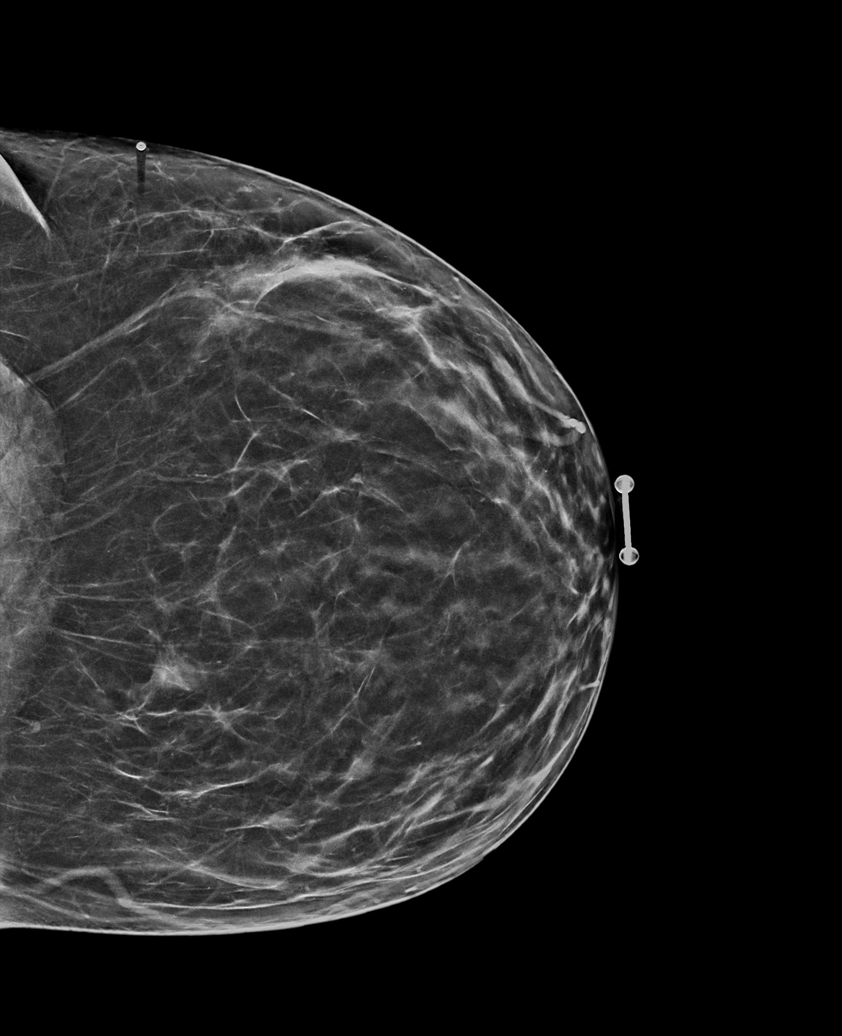

[R CC synth-2D]
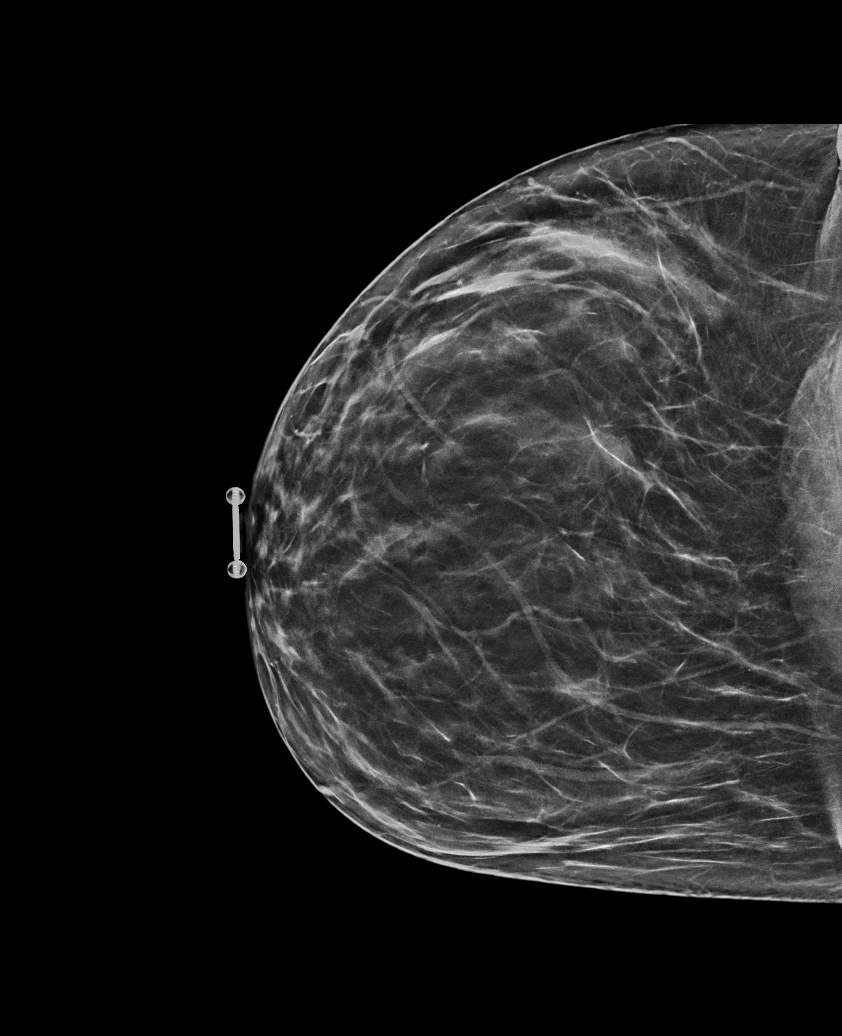

[L CC synth-2D (2 of 2)]
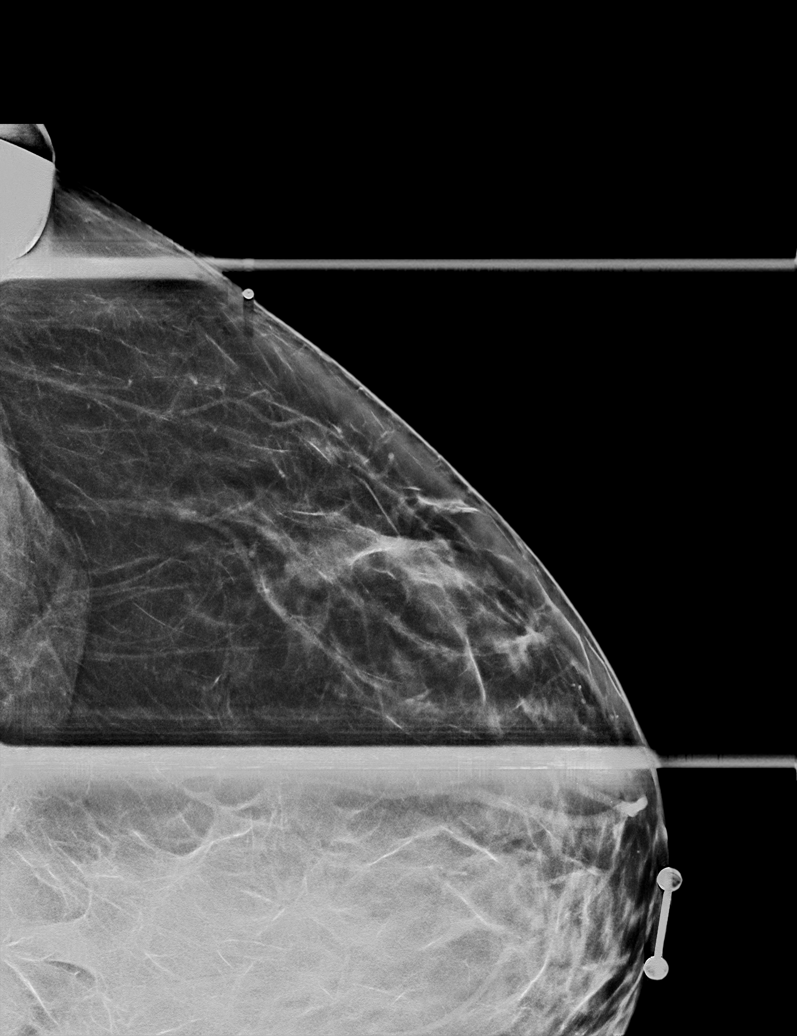

[L MLO synth-2D]
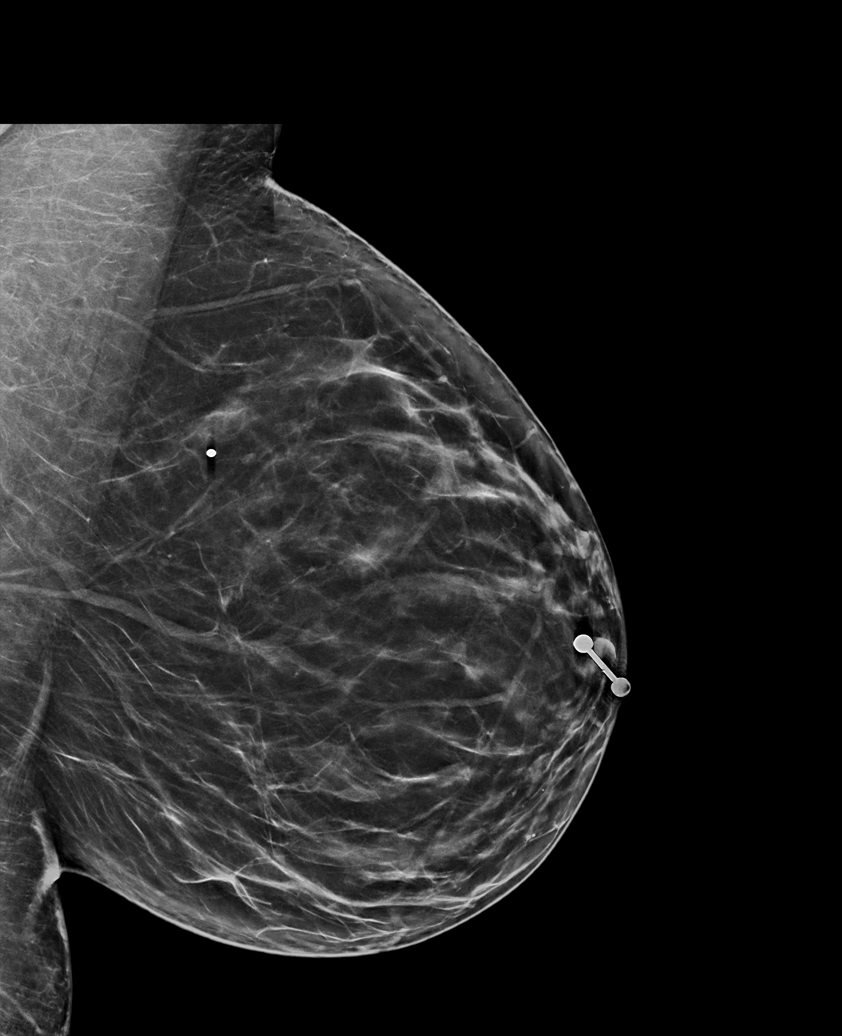

[R MLO tomo · tomo slice 35/70.0]
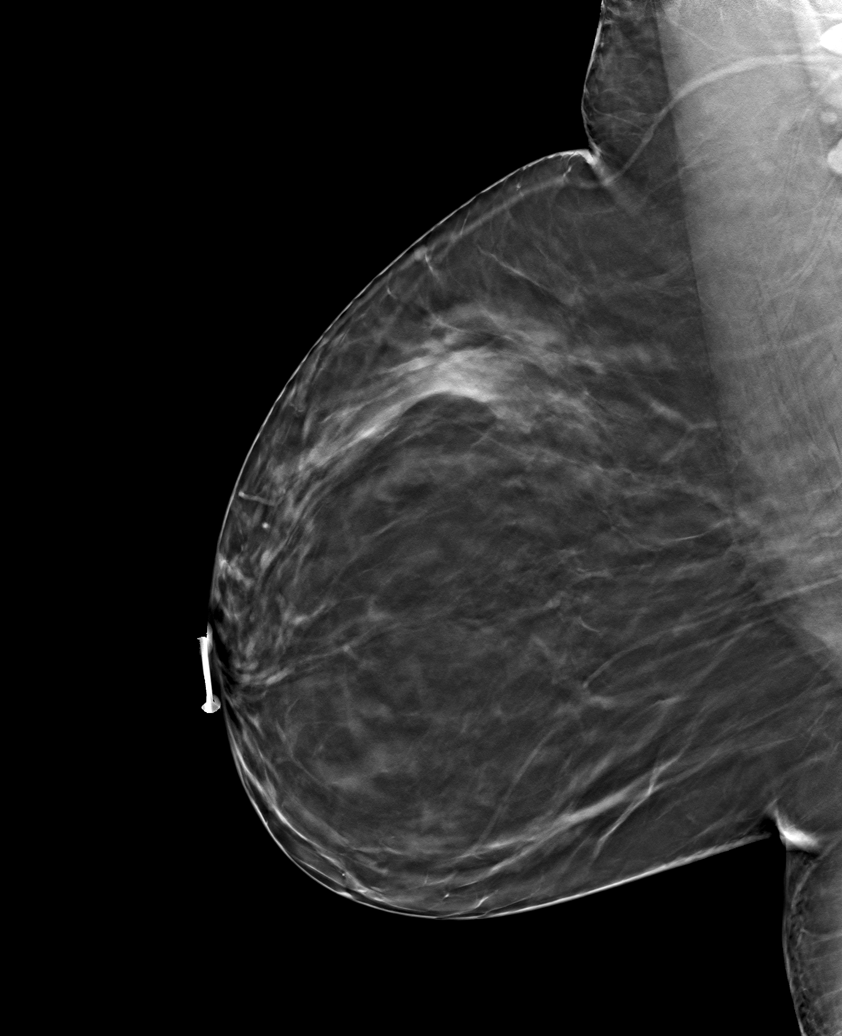

[6 of 30 positions shown; findings below may reference images not displayed]

ACR Breast Density Category b: There are scattered areas of
fibroglandular density.
FINDINGS: There are no dominant masses, suspicious calcifications or secondary
signs of malignancy within either breast. Specifically, there is no
mammographic abnormality within the upper outer quadrant of the LEFT
breast corresponding to the area of clinical concern.

Mammographic images were processed with CAD.

Targeted ultrasound is performed, evaluating the infraclavicular
region of the upper LEFT breast and the upper-outer LEFT breast as
directed by the patient, showing only normal fibroglandular tissues
and fat lobules throughout. No solid or cystic mass. No enlarged or
morphologically abnormal lymph nodes.
IMPRESSION: No evidence of malignancy within either breast. Specifically, no
evidence of malignancy within the upper or outer LEFT breast
corresponding to the areas of clinical concern.

RECOMMENDATION:
1. Screening mammogram at age 40 unless there are persistent or
intervening clinical concerns. (Code:[id number]-[id number])
2. The patient was instructed to return sooner if the area that she
feels becomes larger and/or firmer to palpation, or if a new
palpable abnormality is identified in either breast.

I have discussed the findings and recommendations with the patient.
Results were also provided in writing at the conclusion of the
visit. If applicable, a reminder letter will be sent to the patient
regarding the next appointment.

BI-RADS CATEGORY  1: Negative.

## 2020-08-08 ENCOUNTER — Other Ambulatory Visit: Payer: Self-pay

## 2020-08-08 DIAGNOSIS — Z20822 Contact with and (suspected) exposure to covid-19: Secondary | ICD-10-CM

## 2020-08-10 ENCOUNTER — Telehealth: Payer: Self-pay

## 2020-08-10 LAB — SARS-COV-2, NAA 2 DAY TAT

## 2020-08-10 LAB — NOVEL CORONAVIRUS, NAA: SARS-CoV-2, NAA: NOT DETECTED

## 2020-08-10 NOTE — Telephone Encounter (Signed)
Spoke with pt and she requested her MyChart user name. User name provided- pt verbalized understanding.

## 2021-01-09 ENCOUNTER — Other Ambulatory Visit: Payer: Self-pay | Admitting: General Surgery

## 2021-01-09 DIAGNOSIS — R591 Generalized enlarged lymph nodes: Secondary | ICD-10-CM

## 2021-01-15 ENCOUNTER — Ambulatory Visit
Admission: RE | Admit: 2021-01-15 | Discharge: 2021-01-15 | Disposition: A | Discharge status: Home / Self Care | Payer: No Typology Code available for payment source | Source: Ambulatory Visit | Attending: General Surgery | Admitting: General Surgery

## 2021-01-15 DIAGNOSIS — R591 Generalized enlarged lymph nodes: Secondary | ICD-10-CM

## 2021-11-24 ENCOUNTER — Other Ambulatory Visit: Payer: Self-pay

## 2021-11-24 MED ORDER — COVID-19 AT HOME ANTIGEN TEST VI KIT
PACK | 0 refills | Status: DC
  Filled 2021-11-24: qty 2, 4d supply, fill #0

## 2021-12-08 ENCOUNTER — Inpatient Hospital Stay: Payer: No Typology Code available for payment source | Attending: Oncology | Admitting: Oncology

## 2021-12-08 ENCOUNTER — Inpatient Hospital Stay: Payer: No Typology Code available for payment source

## 2021-12-08 ENCOUNTER — Encounter: Payer: Self-pay | Admitting: Oncology

## 2021-12-08 VITALS — BP 129/90 | HR 66 | Temp 97.6°F | Resp 20 | Ht 61.0 in | Wt 186.2 lb

## 2021-12-08 DIAGNOSIS — Z803 Family history of malignant neoplasm of breast: Secondary | ICD-10-CM | POA: Insufficient documentation

## 2021-12-08 DIAGNOSIS — N920 Excessive and frequent menstruation with regular cycle: Secondary | ICD-10-CM | POA: Diagnosis not present

## 2021-12-08 DIAGNOSIS — D649 Anemia, unspecified: Secondary | ICD-10-CM | POA: Insufficient documentation

## 2021-12-08 DIAGNOSIS — R21 Rash and other nonspecific skin eruption: Secondary | ICD-10-CM | POA: Insufficient documentation

## 2021-12-08 DIAGNOSIS — D508 Other iron deficiency anemias: Secondary | ICD-10-CM

## 2021-12-08 DIAGNOSIS — Z8249 Family history of ischemic heart disease and other diseases of the circulatory system: Secondary | ICD-10-CM | POA: Insufficient documentation

## 2021-12-08 DIAGNOSIS — D509 Iron deficiency anemia, unspecified: Secondary | ICD-10-CM | POA: Insufficient documentation

## 2021-12-08 DIAGNOSIS — E559 Vitamin D deficiency, unspecified: Secondary | ICD-10-CM | POA: Diagnosis not present

## 2021-12-08 DIAGNOSIS — M25562 Pain in left knee: Secondary | ICD-10-CM | POA: Insufficient documentation

## 2021-12-08 NOTE — Progress Notes (Signed)
Hematology/Oncology Consult note [facility] Telephone:([phone] [phone]  Patient Care Team: Marliss Coots, NP as PCP - General   Name of the patient: Lindsey Zamora  [id number]  1986-03-29    Reason for referral- iron deficiency anemia   Referring physician- Jack Quarto NP  Date of visit: 12/08/21   History of presenting illness-patient is a 36 year old female referred for iron deficiency anemia.She has a history of adenomyosis for which she had IUD in place but was recently taken out.  She reports that her menstrual cycles last for about 4 to 5 days and the first few days are particularly heavy.  Her recent labs from 11/26/2021 showed elevated TIBC of 457, iron saturation of 7% and total iron was low at 30.  White count normal at 5.2, H&H of 10.7/34.2 with an MCV of 82.4 and a platelet count of 317.  No family history of colon cancer.  Denies any blood loss in her stool or urine.  Denies any consistent use of NSAIDs.  He reports pain especially in her left knee as compared to right.  Has occasional headaches.  She has an area of hypopigmented skin rash over her medial aspect of the left knee that she is concerned about.  ECOG PS- 0  Pain scale- 0   Review of systems- Review of Systems  Constitutional:  Positive for malaise/fatigue. Negative for chills, fever and weight loss.  HENT:  Negative for congestion, ear discharge and nosebleeds.   Eyes:  Negative for blurred vision.  Respiratory:  Negative for cough, hemoptysis, sputum production, shortness of breath and wheezing.   Cardiovascular:  Negative for chest pain, palpitations, orthopnea and claudication.  Gastrointestinal:  Negative for abdominal pain, blood in stool, constipation, diarrhea, heartburn, melena, nausea and vomiting.  Genitourinary:  Negative for dysuria, flank pain, frequency, hematuria and urgency.  Musculoskeletal:  Negative for back pain, joint pain and myalgias.  Skin:   Negative for rash.  Neurological:  Positive for headaches. Negative for dizziness, tingling, focal weakness, seizures and weakness.  Endo/Heme/Allergies:  Does not bruise/bleed easily.  Psychiatric/Behavioral:  Negative for depression and suicidal ideas. The patient does not have insomnia.    No Known Allergies  Patient Active Problem List   Diagnosis Date Noted   Vitamin D deficiency 12/08/2021   Anemia 12/08/2021   IUD (intrauterine device) in place 03/24/2018   Abnormal vaginal bleeding 03/24/2018     Past Medical History:  Diagnosis Date   Anemia      History reviewed. No pertinent surgical history.  Social History   Socioeconomic History   Marital status: Single    Spouse name: Not on file   Number of children: 2   Years of education: Not on file   Highest education level: 12th grade  Occupational History   Not on file  Tobacco Use   Smoking status: Never   Smokeless tobacco: Never  Vaping Use   Vaping Use: Never used  Substance and Sexual Activity   Alcohol use: Yes    Alcohol/week: 3.0 standard drinks    Types: 3 Glasses of wine per week   Drug use: No   Sexual activity: Not Currently    Partners: Male    Birth control/protection: None, I.U.D.  Other Topics Concern   Not on file  Social History Narrative   Not on file   Social Determinants of Health   Financial Resource Strain: Not on file  Food Insecurity: Not on file  Transportation Needs: Not  on file  Physical Activity: Not on file  Stress: Not on file  Social Connections: Not on file  Intimate Partner Violence: Not on file     Family History  Problem Relation Age of Onset   Congestive Heart Failure Mother    Hypertension Mother    Lupus Mother    Anemia Mother    Breast cancer Maternal Grandmother    Hypertension Maternal Grandmother      Current Outpatient Medications:    acetaminophen (TYLENOL) 500 MG tablet, Take 1,000 mg by mouth every 6 (six) hours as needed for mild pain.  (Patient not taking: Reported on 12/08/2021), Disp: , Rfl:    COVID-19 At Home Antigen Test [facility] COVID-19 HOME TEST) KIT, Use as directed (Patient not taking: Reported on 12/08/2021), Disp: 2 kit, Rfl: 0   ibuprofen (ADVIL) 800 MG tablet, ibuprofen 800 mg tablet  TAKE 1 TABLET BY MOUTH EVERY 8 HOURS AS NEEDED FOR DISCOMFORT (Patient not taking: Reported on 12/08/2021), Disp: , Rfl:    ibuprofen (ADVIL,MOTRIN) 400 MG tablet, Take 400 mg by mouth every 6 (six) hours as needed for headache. (Patient not taking: Reported on 12/08/2021), Disp: , Rfl:    norgestimate-ethinyl estradiol (ORTHO-CYCLEN,SPRINTEC,PREVIFEM) 0.25-35 MG-MCG tablet, Take 1 tablet by mouth daily. (Patient not taking: Reported on 11/09/2018), Disp: 1 Package, Rfl: 0   Vitamin D, Ergocalciferol, (DRISDOL) 1.25 MG (50000 UNIT) CAPS capsule, Take 50,000 Units by mouth once a week. (Patient not taking: Reported on 12/08/2021), Disp: , Rfl:    Physical exam:  Vitals:   12/08/21 0831  BP: 129/90  Pulse: 66  Resp: 20  Temp: 97.6 F (36.4 C)  SpO2: 100%  Weight: 186 lb 3.2 oz (84.5 kg)  Height: 5' 1" (1.549 m)   Physical Exam Cardiovascular:     Rate and Rhythm: Normal rate and regular rhythm.     Heart sounds: Normal heart sounds.  Pulmonary:     Effort: Pulmonary effort is normal.     Breath sounds: Normal breath sounds.  Abdominal:     General: Bowel sounds are normal.     Palpations: Abdomen is soft.  Skin:    Comments: There is an ill-defined macular hypopigmented area noted over the medial aspect of the left knee  Neurological:     Mental Status: She is alert and oriented to person, place, and time.          Latest Ref Rng & Units 07/16/2017    2:52 PM  CMP  Glucose 65 - 99 mg/dL 83    BUN 6 - 20 mg/dL 10    Creatinine 0.44 - 1.00 mg/dL 0.85    Sodium 135 - 145 mmol/L 137    Potassium 3.5 - 5.1 mmol/L 3.6    Chloride 101 - 111 mmol/L 107    CO2 22 - 32 mmol/L 24    Calcium 8.9 - 10.3 mg/dL 8.7         Latest Ref Rng & Units 07/16/2017    2:52 PM  CBC  WBC 4.0 - 10.5 K/uL 6.5    Hemoglobin 12.0 - 15.0 g/dL 11.0    Hematocrit 36.0 - 46.0 % 33.6    Platelets 150 - 400 K/uL 393     Assessment and plan- Patient is a 36 y.o. female referred for iron deficiency anemia  Iron deficiency anemia possibly secondary to menorrhagia.  We discussed oral versus IV iron.  Patient prefers IV iron.  Her most recent hemoglobin was 10 with evidence of  iron deficiency.  We will proceed with 5 doses of Venofer 200 mg IV weekly.  Discussed risks and benefits of Venofer including all but not limited to possible risk of anaphylaxis and infusion reaction.  Patient understands and agrees to proceed as planned.  I will repeat CBC ferritin and iron studies B12 folate TSH and celiac disease panel in 6 weeks time.  We will also check stool H. pylori antigen.  Unlikely that symptoms of knee pain and skin rash are related to iron deficiency.  Patient tells me that autoimmune panel was checked to rule out lupus.  Her mother has lupus.  I do not have the results of autoimmune panel to review.   Thank you for this kind referral and the opportunity to participate in the care of this patient   Visit Diagnosis 1. Anemia, unspecified type     Dr. Randa Evens, MD, MPH [facility] [phone] 12/08/2021

## 2021-12-08 NOTE — Progress Notes (Signed)
Pt states that her body has been experiencing aches,pains,and lots of fatigue. Has had a headache for the past two days and is c/o leg pain.

## 2021-12-09 ENCOUNTER — Other Ambulatory Visit: Payer: No Typology Code available for payment source

## 2021-12-09 ENCOUNTER — Encounter: Payer: No Typology Code available for payment source | Admitting: Oncology

## 2021-12-24 ENCOUNTER — Inpatient Hospital Stay: Payer: No Typology Code available for payment source

## 2021-12-24 ENCOUNTER — Inpatient Hospital Stay (HOSPITAL_BASED_OUTPATIENT_CLINIC_OR_DEPARTMENT_OTHER): Payer: No Typology Code available for payment source | Admitting: Nurse Practitioner

## 2021-12-24 ENCOUNTER — Other Ambulatory Visit: Payer: Self-pay

## 2021-12-24 ENCOUNTER — Encounter: Payer: Self-pay | Admitting: Nurse Practitioner

## 2021-12-24 ENCOUNTER — Inpatient Hospital Stay: Payer: No Typology Code available for payment source | Attending: Oncology

## 2021-12-24 VITALS — BP 128/82 | HR 59 | Temp 98.0°F

## 2021-12-24 VITALS — BP 126/91 | HR 62 | Resp 18

## 2021-12-24 DIAGNOSIS — R079 Chest pain, unspecified: Secondary | ICD-10-CM | POA: Diagnosis not present

## 2021-12-24 DIAGNOSIS — N920 Excessive and frequent menstruation with regular cycle: Secondary | ICD-10-CM | POA: Insufficient documentation

## 2021-12-24 DIAGNOSIS — D508 Other iron deficiency anemias: Secondary | ICD-10-CM

## 2021-12-24 DIAGNOSIS — D649 Anemia, unspecified: Secondary | ICD-10-CM | POA: Diagnosis not present

## 2021-12-24 DIAGNOSIS — D509 Iron deficiency anemia, unspecified: Secondary | ICD-10-CM | POA: Diagnosis present

## 2021-12-24 DIAGNOSIS — R001 Bradycardia, unspecified: Secondary | ICD-10-CM | POA: Diagnosis not present

## 2021-12-24 LAB — CBC WITH DIFFERENTIAL/PLATELET
Abs Immature Granulocytes: 0.01 10*3/uL (ref 0.00–0.07)
Basophils Absolute: 0.1 10*3/uL (ref 0.0–0.1)
Basophils Relative: 1 %
Eosinophils Absolute: 0.2 10*3/uL (ref 0.0–0.5)
Eosinophils Relative: 4 %
HCT: 33.3 % — ABNORMAL LOW (ref 36.0–46.0)
Hemoglobin: 10.6 g/dL — ABNORMAL LOW (ref 12.0–15.0)
Immature Granulocytes: 0 %
Lymphocytes Relative: 34 %
Lymphs Abs: 1.9 10*3/uL (ref 0.7–4.0)
MCH: 25.7 pg — ABNORMAL LOW (ref 26.0–34.0)
MCHC: 31.8 g/dL (ref 30.0–36.0)
MCV: 80.8 fL (ref 80.0–100.0)
Monocytes Absolute: 0.5 10*3/uL (ref 0.1–1.0)
Monocytes Relative: 9 %
Neutro Abs: 2.9 10*3/uL (ref 1.7–7.7)
Neutrophils Relative %: 52 %
Platelets: 387 10*3/uL (ref 150–400)
RBC: 4.12 MIL/uL (ref 3.87–5.11)
RDW: 15.7 % — ABNORMAL HIGH (ref 11.5–15.5)
WBC: 5.5 10*3/uL (ref 4.0–10.5)
nRBC: 0 % (ref 0.0–0.2)

## 2021-12-24 LAB — COMPREHENSIVE METABOLIC PANEL
ALT: 14 U/L (ref 0–44)
AST: 15 U/L (ref 15–41)
Albumin: 3.4 g/dL — ABNORMAL LOW (ref 3.5–5.0)
Alkaline Phosphatase: 54 U/L (ref 38–126)
Anion gap: 7 (ref 5–15)
BUN: 10 mg/dL (ref 6–20)
CO2: 24 mmol/L (ref 22–32)
Calcium: 8.6 mg/dL — ABNORMAL LOW (ref 8.9–10.3)
Chloride: 105 mmol/L (ref 98–111)
Creatinine, Ser: 0.81 mg/dL (ref 0.44–1.00)
GFR, Estimated: >60 mL/min (ref >60)
Glucose, Bld: 84 mg/dL (ref 70–99)
Potassium: 3.7 mmol/L (ref 3.5–5.1)
Sodium: 136 mmol/L (ref 135–145)
Total Bilirubin: 0.5 mg/dL (ref 0.3–1.2)
Total Protein: 7.4 g/dL (ref 6.5–8.1)

## 2021-12-24 LAB — IRON AND TIBC
Iron: 44 ug/dL (ref 28–170)
Saturation Ratios: 9 % — ABNORMAL LOW (ref 10.4–31.8)
TIBC: 496 ug/dL — ABNORMAL HIGH (ref 250–450)
UIBC: 452 ug/dL

## 2021-12-24 LAB — FERRITIN: Ferritin: 4 ng/mL — ABNORMAL LOW (ref 11–307)

## 2021-12-24 LAB — TROPONIN I (HIGH SENSITIVITY): Troponin I (High Sensitivity): 3 ng/L (ref <18)

## 2021-12-24 MED ORDER — SODIUM CHLORIDE 0.9 % IV SOLN: 200.0000 mg | INTRAVENOUS | Status: DC

## 2021-12-24 MED ORDER — IRON SUCROSE 20 MG/ML IV SOLN
200.0000 mg | Freq: Once | INTRAVENOUS | Status: AC
  Administered 2021-12-24: 200 mg via INTRAVENOUS
  Filled 2021-12-24: qty 10

## 2021-12-24 MED ORDER — SODIUM CHLORIDE 0.9 % IV SOLN
Freq: Once | INTRAVENOUS | Status: AC
  Filled 2021-12-24: qty 250

## 2021-12-24 NOTE — Progress Notes (Signed)
Pt add-on to smc with chest discomfort and chest pressure. Pt reports that she first noticed symptoms yesterday. States that she feels a constant "discomfort" in her chest with occasional intermittent chest pains. No change in symptoms with deep breathing. Reports pain mid-sternum and discomfort over left chest area.

## 2021-12-24 NOTE — Progress Notes (Signed)
[facility] [place] [facility] [place]. [facility] [address], [place], [place] [address] [phone] (phone) [phone] (fax)  Patient Care Team: Gates, Audrea Muscat, NP as PCP - General   Name of the patient: Lindsey Zamora  [id number]  17-Dec-1985   Date of visit: 12/24/21  Diagnosis- Iron Deficiency Anemia  Chief complaint/ Reason for visit- Chest Pain  Heme History:  Patient is 36 year old female initially referred for iron deficiency anemia. History of adenomyosis s/p IUD. She has heavy menstrual periods and was found to have iron deficiency. She has plans to start IV iron infusions.   Oncology History   No history exists.    Interval history- Lindsey Zamora is a 36 y.o. female with above history of iron deficiency anemia who [facility] to [facility] for complaints of chest pain. She woke with chest pain this morning and rates 6/10 located in left side to mid chest. Radiates to her stomach. It is unchanged since onset. Denies palpitations, shortness of breath. Nothing seems to make pain better or worse. No history of heart disease personally but family history of CHF. She is not followed by cardiologist. No history of blood clots. Not currently taking oral contraceptives. She does not smoke.   ECOG FS:1 - Symptomatic but completely ambulatory  Review of systems- Review of Systems  Constitutional:  Positive for malaise/fatigue. Negative for chills, fever and weight loss.  HENT:  Negative for hearing loss, nosebleeds, sore throat and tinnitus.   Eyes:  Negative for blurred vision and double vision.  Respiratory:  Negative for cough, hemoptysis, shortness of breath and wheezing.   Cardiovascular:  Positive for chest pain. Negative for palpitations and leg swelling.  Gastrointestinal:  Negative for abdominal pain, blood in stool, constipation, diarrhea, melena, nausea and  vomiting.  Genitourinary:  Negative for dysuria and urgency.  Musculoskeletal:  Negative for back pain, falls, joint pain and myalgias.  Skin:  Negative for itching and rash.  Neurological:  Negative for dizziness, tingling, sensory change, loss of consciousness, weakness and headaches.  Endo/Heme/Allergies:  Negative for environmental allergies. Does not bruise/bleed easily.  Psychiatric/Behavioral:  Negative for depression. The patient is not nervous/anxious and does not have insomnia.     No Known Allergies  Past Medical History:  Diagnosis Date   Anemia    No past surgical history on file.  Social History   Socioeconomic History   Marital status: Single    Spouse name: Not on file   Number of children: 2   Years of education: Not on file   Highest education level: 12th grade  Occupational History   Not on file  Tobacco Use   Smoking status: Never   Smokeless tobacco: Never  Vaping Use   Vaping Use: Never used  Substance and Sexual Activity   Alcohol use: Yes    Alcohol/week: 3.0 standard drinks of alcohol    Types: 3 Glasses of wine per week   Drug use: No   Sexual activity: Not Currently    Partners: Male    Birth control/protection: None, I.U.D.  Other Topics Concern   Not on file  Social History Narrative   Not on file   Social Determinants of Health   Financial Resource Strain: Not on file  Food Insecurity: Not on file  Transportation Needs: Not on file  Physical Activity: Not on file  Stress: Not on file  Social Connections: Not  on file  Intimate Partner Violence: Not on file   Family History  Problem Relation Age of Onset   Congestive Heart Failure Mother    Hypertension Mother    Lupus Mother    Anemia Mother    Breast cancer Maternal Grandmother    Hypertension Maternal Grandmother    Current Outpatient Medications:    acetaminophen (TYLENOL) 500 MG tablet, Take 1,000 mg by mouth every 6 (six) hours as needed for mild pain. (Patient not  taking: Reported on 12/08/2021), Disp: , Rfl:    COVID-19 At Home Antigen Test [facility] COVID-19 HOME TEST) KIT, Use as directed (Patient not taking: Reported on 12/08/2021), Disp: 2 kit, Rfl: 0   ibuprofen (ADVIL) 800 MG tablet, ibuprofen 800 mg tablet  TAKE 1 TABLET BY MOUTH EVERY 8 HOURS AS NEEDED FOR DISCOMFORT (Patient not taking: Reported on 12/08/2021), Disp: , Rfl:    ibuprofen (ADVIL,MOTRIN) 400 MG tablet, Take 400 mg by mouth every 6 (six) hours as needed for headache. (Patient not taking: Reported on 12/08/2021), Disp: , Rfl:    norgestimate-ethinyl estradiol (ORTHO-CYCLEN,SPRINTEC,PREVIFEM) 0.25-35 MG-MCG tablet, Take 1 tablet by mouth daily. (Patient not taking: Reported on 11/09/2018), Disp: 1 Package, Rfl: 0   Vitamin D, Ergocalciferol, (DRISDOL) 1.25 MG (50000 UNIT) CAPS capsule, Take 50,000 Units by mouth once a week. (Patient not taking: Reported on 12/08/2021), Disp: , Rfl:   Physical exam:  Vitals:   12/24/21 0948 12/24/21 1116  BP: (!) 139/100 (!) 126/91  Pulse: 62   Resp: 18   SpO2: 100%    Physical Exam Constitutional:      General: She is not in acute distress.    Appearance: She is well-developed. She is not ill-appearing.  HENT:     Head: Atraumatic.     Nose: Nose normal.     Mouth/Throat:     Pharynx: No oropharyngeal exudate.  Eyes:     General: No scleral icterus.    Conjunctiva/sclera: Conjunctivae normal.  Cardiovascular:     Rate and Rhythm: Normal rate and regular rhythm.     Pulses:          Radial pulses are 2+ on the right side and 2+ on the left side.     Heart sounds: Normal heart sounds. No murmur heard. Pulmonary:     Effort: Pulmonary effort is normal. No tachypnea or respiratory distress.     Breath sounds: Normal breath sounds.  Chest:     Chest wall: No mass or tenderness.  Abdominal:     General: There is no distension.     Palpations: Abdomen is soft.     Tenderness: There is no abdominal tenderness. There is no guarding or rebound.   Musculoskeletal:        General: No tenderness or deformity.     Cervical back: Normal range of motion and neck supple.     Right lower leg: No edema.     Left lower leg: No edema.  Skin:    General: Skin is warm and dry.     Coloration: Skin is not pale.  Neurological:     General: No focal deficit present.     Mental Status: She is alert and oriented to person, place, and time.  Psychiatric:        Mood and Affect: Mood is anxious.        Behavior: Behavior normal.        Latest Ref Rng & Units 07/16/2017    2:52 PM  CMP  Glucose 65 - 99 mg/dL 83   BUN 6 - 20 mg/dL 10   Creatinine 0.44 - 1.00 mg/dL 0.85   Sodium 135 - 145 mmol/L 137   Potassium 3.5 - 5.1 mmol/L 3.6   Chloride 101 - 111 mmol/L 107   CO2 22 - 32 mmol/L 24   Calcium 8.9 - 10.3 mg/dL 8.7       Latest Ref Rng & Units 07/16/2017    2:52 PM  CBC  WBC 4.0 - 10.5 K/uL 6.5   Hemoglobin 12.0 - 15.0 g/dL 11.0   Hematocrit 36.0 - 46.0 % 33.6   Platelets 150 - 400 K/uL 393    Iron/TIBC/Ferritin/ %Sat    Component Value Date/Time   IRON 44 12/24/2021 0930   TIBC 496 (H) 12/24/2021 0930   FERRITIN 4 (L) 12/24/2021 0930   IRONPCTSAT 9 (L) 12/24/2021 0930   Troponin I (High Sensitivity) <18 ng/L 3    No images are attached to the encounter.  No results found.  Assessment and plan- Patient is a 36 y.o. female with Iron Deficiency who presents for chest pain. This is a new problem. Clinically well appearing but hypertensive. EKG reviewed and was negative for ischemic event, normal sinus rhythm. Troponin was negative. Clinically, suspect either GI etiology or secondary to her iron deficiency. She is scheduled to start iron infusions today which can proceed with. She declines acid reducing medications in clinic. Hypertension improved with rest. Encouraged her to follow up with PCP. ER precautions reviewed.   RTC for follow up as needed.    Visit Diagnosis 1. Other iron deficiency anemia   2. Chest pain,  unspecified type   3. Anemia, unspecified type    Patient expressed understanding and was in agreement with this plan. She also understands that She can call clinic at any time with any questions, concerns, or complaints.   Thank you for allowing me to participate in the care of this very pleasant patient.   Beckey Rutter, DNP, AGNP-[facility] [phone]  CC: Dr. Janese Banks, Marliss Coots, NP

## 2021-12-24 NOTE — Patient Instructions (Signed)

## 2021-12-28 ENCOUNTER — Inpatient Hospital Stay: Payer: No Typology Code available for payment source

## 2021-12-28 VITALS — BP 124/79 | HR 66 | Temp 99.2°F | Resp 16

## 2021-12-28 DIAGNOSIS — D509 Iron deficiency anemia, unspecified: Secondary | ICD-10-CM | POA: Diagnosis not present

## 2021-12-28 DIAGNOSIS — D508 Other iron deficiency anemias: Secondary | ICD-10-CM

## 2021-12-28 MED ORDER — SODIUM CHLORIDE 0.9 % IV SOLN: 200.0000 mg | INTRAVENOUS | Status: DC

## 2021-12-28 MED ORDER — SODIUM CHLORIDE 0.9 % IV SOLN
Freq: Once | INTRAVENOUS | Status: AC
  Filled 2021-12-28: qty 250

## 2021-12-28 MED ORDER — IRON SUCROSE 20 MG/ML IV SOLN
200.0000 mg | Freq: Once | INTRAVENOUS | Status: AC
  Administered 2021-12-28: 200 mg via INTRAVENOUS
  Filled 2021-12-28: qty 10

## 2021-12-28 NOTE — Patient Instructions (Signed)
[facility]  Discharge Instructions: Thank you for choosing [facility] to provide your oncology and hematology care.  If you have a lab appointment with the Cancer Center, please go directly to the Cancer Center and check in at the registration area.  Wear comfortable clothing and clothing appropriate for easy access to any Portacath or PICC line.   We strive to give you quality time with your provider. You may need to reschedule your appointment if you arrive late (15 or more minutes).  Arriving late affects you and other patients whose appointments are after yours.  Also, if you miss three or more appointments without notifying the office, you may be dismissed from the clinic at the provider's discretion.      For prescription refill requests, have your pharmacy contact our office and allow 72 hours for refills to be completed.    Today you received the following chemotherapy and/or immunotherapy agents Venofer.      To help prevent nausea and vomiting after your treatment, we encourage you to take your nausea medication as directed.  BELOW ARE SYMPTOMS THAT SHOULD BE REPORTED IMMEDIATELY: *FEVER GREATER THAN 100.4 F (38 C) OR HIGHER *CHILLS OR SWEATING *NAUSEA AND VOMITING THAT IS NOT CONTROLLED WITH YOUR NAUSEA MEDICATION *UNUSUAL SHORTNESS OF BREATH *UNUSUAL BRUISING OR BLEEDING *URINARY PROBLEMS (pain or burning when urinating, or frequent urination) *BOWEL PROBLEMS (unusual diarrhea, constipation, pain near the anus) TENDERNESS IN MOUTH AND THROAT WITH OR WITHOUT PRESENCE OF ULCERS (sore throat, sores in mouth, or a toothache) UNUSUAL RASH, SWELLING OR PAIN  UNUSUAL VAGINAL DISCHARGE OR ITCHING   Items with * indicate a potential emergency and should be followed up as soon as possible or go to the Emergency Department if any problems should occur.  Please show the CHEMOTHERAPY ALERT CARD or IMMUNOTHERAPY ALERT CARD at check-in to  the Emergency Department and triage nurse.  Should you have questions after your visit or need to cancel or reschedule your appointment, please contact [facility]  [phone] and follow the prompts.  Office hours are 8:00 a.m. to 4:30 p.m. Monday - Friday. Please note that voicemails left after 4:00 p.m. may not be returned until the following business day.  We are closed weekends and major holidays. You have access to a nurse at all times for urgent questions. Please call the main number to the clinic [phone] and follow the prompts.  For any non-urgent questions, you may also contact your provider using MyChart. We now offer e-Visits for anyone 18 and older to request care online for non-urgent symptoms. For details visit [email or web address].   Also download the MyChart app! Go to the app store, search "MyChart", open the app, select [facility], and log in with your MyChart username and password.  Due to Covid, a mask is required upon entering the hospital/clinic. If you do not have a mask, one will be given to you upon arrival. For doctor visits, patients may have 1 support person aged 18 or older with them. For treatment visits, patients cannot have anyone with them due to current Covid guidelines and our immunocompromised population.  

## 2021-12-30 ENCOUNTER — Inpatient Hospital Stay: Payer: No Typology Code available for payment source

## 2021-12-30 VITALS — BP 123/78 | HR 71 | Temp 98.6°F | Resp 20

## 2021-12-30 DIAGNOSIS — D508 Other iron deficiency anemias: Secondary | ICD-10-CM

## 2021-12-30 DIAGNOSIS — D509 Iron deficiency anemia, unspecified: Secondary | ICD-10-CM | POA: Diagnosis not present

## 2021-12-30 MED ORDER — IRON SUCROSE 20 MG/ML IV SOLN
200.0000 mg | Freq: Once | INTRAVENOUS | Status: AC
  Administered 2021-12-30: 200 mg via INTRAVENOUS
  Filled 2021-12-30: qty 10

## 2021-12-30 MED ORDER — SODIUM CHLORIDE 0.9 % IV SOLN
Freq: Once | INTRAVENOUS | Status: AC
  Filled 2021-12-30: qty 250

## 2021-12-30 MED ORDER — SODIUM CHLORIDE 0.9 % IV SOLN: 200.0000 mg | INTRAVENOUS | Status: DC

## 2021-12-30 NOTE — Patient Instructions (Signed)
[facility]  Discharge Instructions: Thank you for choosing [facility] to provide your oncology and hematology care.  If you have a lab appointment with the Cancer Center, please go directly to the Cancer Center and check in at the registration area.  Wear comfortable clothing and clothing appropriate for easy access to any Portacath or PICC line.   We strive to give you quality time with your provider. You may need to reschedule your appointment if you arrive late (15 or more minutes).  Arriving late affects you and other patients whose appointments are after yours.  Also, if you miss three or more appointments without notifying the office, you may be dismissed from the clinic at the provider's discretion.      For prescription refill requests, have your pharmacy contact our office and allow 72 hours for refills to be completed.       To help prevent nausea and vomiting after your treatment, we encourage you to take your nausea medication as directed.  BELOW ARE SYMPTOMS THAT SHOULD BE REPORTED IMMEDIATELY: *FEVER GREATER THAN 100.4 F (38 C) OR HIGHER *CHILLS OR SWEATING *NAUSEA AND VOMITING THAT IS NOT CONTROLLED WITH YOUR NAUSEA MEDICATION *UNUSUAL SHORTNESS OF BREATH *UNUSUAL BRUISING OR BLEEDING *URINARY PROBLEMS (pain or burning when urinating, or frequent urination) *BOWEL PROBLEMS (unusual diarrhea, constipation, pain near the anus) TENDERNESS IN MOUTH AND THROAT WITH OR WITHOUT PRESENCE OF ULCERS (sore throat, sores in mouth, or a toothache) UNUSUAL RASH, SWELLING OR PAIN  UNUSUAL VAGINAL DISCHARGE OR ITCHING   Items with * indicate a potential emergency and should be followed up as soon as possible or go to the Emergency Department if any problems should occur.  Please show the CHEMOTHERAPY ALERT CARD or IMMUNOTHERAPY ALERT CARD at check-in to the Emergency Department and triage nurse.  Should you have questions after your  visit or need to cancel or reschedule your appointment, please contact [facility]  [phone] and follow the prompts.  Office hours are 8:00 a.m. to 4:30 p.m. Monday - Friday. Please note that voicemails left after 4:00 p.m. may not be returned until the following business day.  We are closed weekends and major holidays. You have access to a nurse at all times for urgent questions. Please call the main number to the clinic [phone] and follow the prompts.  For any non-urgent questions, you may also contact your provider using MyChart. We now offer e-Visits for anyone 18 and older to request care online for non-urgent symptoms. For details visit [email or web address].   Also download the MyChart app! Go to the app store, search "MyChart", open the app, select [facility], and log in with your MyChart username and password.  Masks are optional in the cancer centers. If you would like for your care team to wear a mask while they are taking care of you, please let them know. For doctor visits, patients may have with them one support person who is at least 36 years old. At this time, visitors are not allowed in the infusion area.   

## 2022-01-05 ENCOUNTER — Inpatient Hospital Stay: Payer: No Typology Code available for payment source

## 2022-01-05 VITALS — BP 128/86 | HR 66 | Temp 97.3°F | Resp 18

## 2022-01-05 DIAGNOSIS — D509 Iron deficiency anemia, unspecified: Secondary | ICD-10-CM | POA: Diagnosis not present

## 2022-01-05 DIAGNOSIS — D508 Other iron deficiency anemias: Secondary | ICD-10-CM

## 2022-01-05 MED ORDER — SODIUM CHLORIDE 0.9 % IV SOLN
Freq: Once | INTRAVENOUS | Status: AC
  Filled 2022-01-05: qty 250

## 2022-01-05 MED ORDER — SODIUM CHLORIDE 0.9 % IV SOLN: 200.0000 mg | INTRAVENOUS | Status: DC

## 2022-01-05 MED ORDER — IRON SUCROSE 20 MG/ML IV SOLN
200.0000 mg | Freq: Once | INTRAVENOUS | Status: AC
  Administered 2022-01-05: 200 mg via INTRAVENOUS
  Filled 2022-01-05: qty 10

## 2022-01-05 NOTE — Patient Instructions (Signed)
[facility]  Discharge Instructions: Thank you for choosing [facility] to provide your oncology and hematology care.  If you have a lab appointment with the Cancer Center, please go directly to the Cancer Center and check in at the registration area.  Wear comfortable clothing and clothing appropriate for easy access to any Portacath or PICC line.   We strive to give you quality time with your provider. You may need to reschedule your appointment if you arrive late (15 or more minutes).  Arriving late affects you and other patients whose appointments are after yours.  Also, if you miss three or more appointments without notifying the office, you may be dismissed from the clinic at the provider's discretion.      For prescription refill requests, have your pharmacy contact our office and allow 72 hours for refills to be completed.    Today you received the following chemotherapy and/or immunotherapy agents VENOFER      To help prevent nausea and vomiting after your treatment, we encourage you to take your nausea medication as directed.  BELOW ARE SYMPTOMS THAT SHOULD BE REPORTED IMMEDIATELY: *FEVER GREATER THAN 100.4 F (38 C) OR HIGHER *CHILLS OR SWEATING *NAUSEA AND VOMITING THAT IS NOT CONTROLLED WITH YOUR NAUSEA MEDICATION *UNUSUAL SHORTNESS OF BREATH *UNUSUAL BRUISING OR BLEEDING *URINARY PROBLEMS (pain or burning when urinating, or frequent urination) *BOWEL PROBLEMS (unusual diarrhea, constipation, pain near the anus) TENDERNESS IN MOUTH AND THROAT WITH OR WITHOUT PRESENCE OF ULCERS (sore throat, sores in mouth, or a toothache) UNUSUAL RASH, SWELLING OR PAIN  UNUSUAL VAGINAL DISCHARGE OR ITCHING   Items with * indicate a potential emergency and should be followed up as soon as possible or go to the Emergency Department if any problems should occur.  Please show the CHEMOTHERAPY ALERT CARD or IMMUNOTHERAPY ALERT CARD at check-in to the  Emergency Department and triage nurse.  Should you have questions after your visit or need to cancel or reschedule your appointment, please contact [facility]  [phone] and follow the prompts.  Office hours are 8:00 a.m. to 4:30 p.m. Monday - Friday. Please note that voicemails left after 4:00 p.m. may not be returned until the following business day.  We are closed weekends and major holidays. You have access to a nurse at all times for urgent questions. Please call the main number to the clinic [phone] and follow the prompts.  For any non-urgent questions, you may also contact your provider using MyChart. We now offer e-Visits for anyone 18 and older to request care online for non-urgent symptoms. For details visit [email or web address].   Also download the MyChart app! Go to the app store, search "MyChart", open the app, select , and log in with your MyChart username and password.  Masks are optional in the cancer centers. If you would like for your care team to wear a mask while they are taking care of you, please let them know. For doctor visits, patients may have with them one support person who is at least 36 years old. At this time, visitors are not allowed in the infusion area.   Iron Sucrose Injection What is this medication? IRON SUCROSE (EYE ern SOO krose) treats low levels of iron (iron deficiency anemia) in people with kidney disease. Iron is a mineral that plays an important role in making red blood cells, which carry oxygen from your lungs to the rest of your body. This medicine may   be used for other purposes; ask your health care provider or pharmacist if you have questions. COMMON BRAND NAME(S): Venofer What should I tell my care team before I take this medication? They need to know if you have any of these conditions: Anemia not caused by low iron levels Heart disease High levels of iron in the blood Kidney disease Liver  disease An unusual or allergic reaction to iron, other medications, foods, dyes, or preservatives Pregnant or trying to get pregnant Breast-feeding How should I use this medication? This medication is for infusion into a vein. It is given in a hospital or clinic setting. Talk to your care team about the use of this medication in children. While this medication may be prescribed for children as young as 2 years for selected conditions, precautions do apply. Overdosage: If you think you have taken too much of this medicine contact a poison control center or emergency room at once. NOTE: This medicine is only for you. Do not share this medicine with others. What if I miss a dose? It is important not to miss your dose. Call your care team if you are unable to keep an appointment. What may interact with this medication? Do not take this medication with any of the following: Deferoxamine Dimercaprol Other iron products This medication may also interact with the following: Chloramphenicol Deferasirox This list may not describe all possible interactions. Give your health care provider a list of all the medicines, herbs, non-prescription drugs, or dietary supplements you use. Also tell them if you smoke, drink alcohol, or use illegal drugs. Some items may interact with your medicine. What should I watch for while using this medication? Visit your care team regularly. Tell your care team if your symptoms do not start to get better or if they get worse. You may need blood work done while you are taking this medication. You may need to follow a special diet. Talk to your care team. Foods that contain iron include: whole grains/cereals, dried fruits, beans, or peas, leafy green vegetables, and organ meats (liver, kidney). What side effects may I notice from receiving this medication? Side effects that you should report to your care team as soon as possible: Allergic reactions--skin rash, itching, hives,  swelling of the face, lips, tongue, or throat Low blood pressure--dizziness, feeling faint or lightheaded, blurry vision Shortness of breath Side effects that usually do not require medical attention (report to your care team if they continue or are bothersome): Flushing Headache Joint pain Muscle pain Nausea Pain, redness, or irritation at injection site This list may not describe all possible side effects. Call your doctor for medical advice about side effects. You may report side effects to FDA at 1-800-FDA-1088. Where should I keep my medication? This medication is given in a hospital or clinic and will not be stored at home. NOTE: This sheet is a summary. It may not cover all possible information. If you have questions about this medicine, talk to your doctor, pharmacist, or health care provider.  2023 Elsevier/Gold Standard (2020-11-28 00:00:00)   

## 2022-01-07 ENCOUNTER — Inpatient Hospital Stay: Payer: No Typology Code available for payment source

## 2022-01-07 VITALS — BP 128/96 | HR 70 | Temp 98.0°F | Resp 16

## 2022-01-07 DIAGNOSIS — D508 Other iron deficiency anemias: Secondary | ICD-10-CM

## 2022-01-07 DIAGNOSIS — D509 Iron deficiency anemia, unspecified: Secondary | ICD-10-CM | POA: Diagnosis not present

## 2022-01-07 MED ORDER — SODIUM CHLORIDE 0.9 % IV SOLN
Freq: Once | INTRAVENOUS | Status: AC
  Filled 2022-01-07: qty 250

## 2022-01-07 MED ORDER — SODIUM CHLORIDE 0.9 % IV SOLN: 200.0000 mg | INTRAVENOUS | Status: DC

## 2022-01-07 MED ORDER — IRON SUCROSE 20 MG/ML IV SOLN
200.0000 mg | Freq: Once | INTRAVENOUS | Status: AC
  Administered 2022-01-07: 200 mg via INTRAVENOUS
  Filled 2022-01-07: qty 10

## 2022-01-21 ENCOUNTER — Inpatient Hospital Stay: Payer: No Typology Code available for payment source | Attending: Oncology

## 2022-01-21 DIAGNOSIS — E538 Deficiency of other specified B group vitamins: Secondary | ICD-10-CM | POA: Insufficient documentation

## 2022-01-21 DIAGNOSIS — Z862 Personal history of diseases of the blood and blood-forming organs and certain disorders involving the immune mechanism: Secondary | ICD-10-CM | POA: Insufficient documentation

## 2022-01-21 DIAGNOSIS — D649 Anemia, unspecified: Secondary | ICD-10-CM

## 2022-01-21 LAB — FOLATE: Folate: 8.7 ng/mL (ref >5.9)

## 2022-01-21 LAB — CBC WITH DIFFERENTIAL/PLATELET
Abs Immature Granulocytes: 0.01 10*3/uL (ref 0.00–0.07)
Basophils Absolute: 0.1 10*3/uL (ref 0.0–0.1)
Basophils Relative: 1 %
Eosinophils Absolute: 0.2 10*3/uL (ref 0.0–0.5)
Eosinophils Relative: 3 %
HCT: 37.4 % (ref 36.0–46.0)
Hemoglobin: 12.1 g/dL (ref 12.0–15.0)
Immature Granulocytes: 0 %
Lymphocytes Relative: 34 %
Lymphs Abs: 1.9 10*3/uL (ref 0.7–4.0)
MCH: 27.4 pg (ref 26.0–34.0)
MCHC: 32.4 g/dL (ref 30.0–36.0)
MCV: 84.8 fL (ref 80.0–100.0)
Monocytes Absolute: 0.5 10*3/uL (ref 0.1–1.0)
Monocytes Relative: 9 %
Neutro Abs: 3 10*3/uL (ref 1.7–7.7)
Neutrophils Relative %: 53 %
Platelets: 359 10*3/uL (ref 150–400)
RBC: 4.41 MIL/uL (ref 3.87–5.11)
RDW: 19.8 % — ABNORMAL HIGH (ref 11.5–15.5)
WBC: 5.6 10*3/uL (ref 4.0–10.5)
nRBC: 0 % (ref 0.0–0.2)

## 2022-01-21 LAB — FERRITIN: Ferritin: 113 ng/mL (ref 11–307)

## 2022-01-21 LAB — VITAMIN B12: Vitamin B-12: 233 pg/mL (ref 180–914)

## 2022-01-21 LAB — IRON AND TIBC
Iron: 107 ug/dL (ref 28–170)
Saturation Ratios: 29 % (ref 10.4–31.8)
TIBC: 372 ug/dL (ref 250–450)
UIBC: 265 ug/dL

## 2022-01-21 LAB — PREGNANCY, URINE: Preg Test, Ur: NEGATIVE

## 2022-01-22 LAB — GLIADIN ANTIBODIES, SERUM
Antigliadin Abs, IgA: 3 units (ref 0–19)
Gliadin IgG: 2 units (ref 0–19)

## 2022-01-22 LAB — TISSUE TRANSGLUTAMINASE, IGA: Tissue Transglutaminase Ab, IgA: <2 U/mL (ref 0–3)

## 2022-01-23 LAB — RETICULIN ANTIBODIES, IGA W TITER: Reticulin Ab, IgA: NEGATIVE titer (ref <2.5)

## 2022-01-25 ENCOUNTER — Encounter: Payer: Self-pay | Admitting: Oncology

## 2022-01-25 ENCOUNTER — Inpatient Hospital Stay (HOSPITAL_BASED_OUTPATIENT_CLINIC_OR_DEPARTMENT_OTHER): Payer: No Typology Code available for payment source | Admitting: Oncology

## 2022-01-25 VITALS — BP 131/96 | HR 66 | Temp 97.3°F | Resp 16 | Wt 184.1 lb

## 2022-01-25 DIAGNOSIS — D508 Other iron deficiency anemias: Secondary | ICD-10-CM

## 2022-01-25 DIAGNOSIS — E538 Deficiency of other specified B group vitamins: Secondary | ICD-10-CM | POA: Diagnosis not present

## 2022-01-25 DIAGNOSIS — Z862 Personal history of diseases of the blood and blood-forming organs and certain disorders involving the immune mechanism: Secondary | ICD-10-CM | POA: Diagnosis not present

## 2022-01-25 NOTE — Progress Notes (Signed)
Hematology/Oncology Consult note [facility]  [phone] [phone]  Patient Care Team: Marliss Coots, NP as PCP - General   Name of the patient: Lindsey Zamora  [id number]  [birth year]   Date of visit: 01/25/22  Diagnosis-iron deficiency anemia likely secondary to menorrhagia  Chief complaint/ Reason for visit-routine follow-up of iron deficiency anemia  Heme/Onc history: patient is a 36 year old female referred for iron deficiency anemia.She has a history of adenomyosis for which she had IUD in place but was recently taken out.  She reports that her menstrual cycles last for about 4 to 5 days and the first few days are particularly heavy.  Her recent labs from 11/26/2021 showed elevated TIBC of 457, iron saturation of 7% and total iron was low at 30.  White count normal at 5.2, H&H of 10.7/34.2 with an MCV of 82.4 and a platelet count of 317.  No family history of colon cancer.  Denies any blood loss in her stool or urine.  Denies any consistent use of NSAIDs.   Patient received 5 doses of Venofer in June 2023.   Interval history-patient reports improvement in her energy levels after receiving IV iron.  Knee pain is also better.  Her last menstrual cycle was not as heavy.  ECOG PS- 0 Pain scale- 0   Review of systems- Review of Systems  Constitutional:  Negative for chills, fever, malaise/fatigue and weight loss.  HENT:  Negative for congestion, ear discharge and nosebleeds.   Eyes:  Negative for blurred vision.  Respiratory:  Negative for cough, hemoptysis, sputum production, shortness of breath and wheezing.   Cardiovascular:  Negative for chest pain, palpitations, orthopnea and claudication.  Gastrointestinal:  Negative for abdominal pain, blood in stool, constipation, diarrhea, heartburn, melena, nausea and vomiting.  Genitourinary:  Negative for dysuria, flank pain, frequency, hematuria and urgency.  Musculoskeletal:  Negative  for back pain, joint pain and myalgias.  Skin:  Negative for rash.  Neurological:  Negative for dizziness, tingling, focal weakness, seizures, weakness and headaches.  Endo/Heme/Allergies:  Does not bruise/bleed easily.  Psychiatric/Behavioral:  Negative for depression and suicidal ideas. The patient does not have insomnia.       No Known Allergies   Past Medical History:  Diagnosis Date   Anemia      History reviewed. No pertinent surgical history.  Social History   Socioeconomic History   Marital status: Single    Spouse name: Not on file   Number of children: 2   Years of education: Not on file   Highest education level: 12th grade  Occupational History   Not on file  Tobacco Use   Smoking status: Never   Smokeless tobacco: Never  Vaping Use   Vaping Use: Never used  Substance and Sexual Activity   Alcohol use: Yes    Alcohol/week: 3.0 standard drinks of alcohol    Types: 3 Glasses of wine per week   Drug use: No   Sexual activity: Not Currently    Partners: Male    Birth control/protection: None, I.U.D.  Other Topics Concern   Not on file  Social History Narrative   Not on file   Social Determinants of Health   Financial Resource Strain: Not on file  Food Insecurity: Not on file  Transportation Needs: Not on file  Physical Activity: Not on file  Stress: Not on file  Social Connections: Not on file  Intimate Partner Violence: Not on file    Family  History  Problem Relation Age of Onset   Congestive Heart Failure Mother    Hypertension Mother    Lupus Mother    Anemia Mother    Breast cancer Maternal Grandmother    Hypertension Maternal Grandmother      Current Outpatient Medications:    acetaminophen (TYLENOL) 500 MG tablet, Take 1,000 mg by mouth every 6 (six) hours as needed for mild pain. (Patient not taking: Reported on 12/08/2021), Disp: , Rfl:    COVID-19 At Home Antigen Test [facility] COVID-19 HOME TEST) KIT, Use as directed (Patient  not taking: Reported on 12/08/2021), Disp: 2 kit, Rfl: 0   ibuprofen (ADVIL) 800 MG tablet, ibuprofen 800 mg tablet  TAKE 1 TABLET BY MOUTH EVERY 8 HOURS AS NEEDED FOR DISCOMFORT (Patient not taking: Reported on 12/08/2021), Disp: , Rfl:    ibuprofen (ADVIL,MOTRIN) 400 MG tablet, Take 400 mg by mouth every 6 (six) hours as needed for headache. (Patient not taking: Reported on 12/08/2021), Disp: , Rfl:    norgestimate-ethinyl estradiol (ORTHO-CYCLEN,SPRINTEC,PREVIFEM) 0.25-35 MG-MCG tablet, Take 1 tablet by mouth daily. (Patient not taking: Reported on 11/09/2018), Disp: 1 Package, Rfl: 0   Vitamin D, Ergocalciferol, (DRISDOL) 1.25 MG (50000 UNIT) CAPS capsule, Take 50,000 Units by mouth once a week. (Patient not taking: Reported on 12/08/2021), Disp: , Rfl:   Physical exam:  Vitals:   01/25/22 1458  BP: (!) 131/96  Pulse: 66  Resp: 16  Temp: (!) 97.3 F (36.3 C)  SpO2: 98%  Weight: 184 lb 1.6 oz (83.5 kg)   Physical Exam Constitutional:      General: She is not in acute distress. Cardiovascular:     Rate and Rhythm: Normal rate and regular rhythm.     Heart sounds: Normal heart sounds.  Pulmonary:     Effort: Pulmonary effort is normal.  Skin:    General: Skin is warm and dry.  Neurological:     Mental Status: She is alert and oriented to person, place, and time.         Latest Ref Rng & Units 12/24/2021    9:30 AM  CMP  Glucose 70 - 99 mg/dL 84   BUN 6 - 20 mg/dL 10   Creatinine 0.44 - 1.00 mg/dL 0.81   Sodium 135 - 145 mmol/L 136   Potassium 3.5 - 5.1 mmol/L 3.7   Chloride 98 - 111 mmol/L 105   CO2 22 - 32 mmol/L 24   Calcium 8.9 - 10.3 mg/dL 8.6   Total Protein 6.5 - 8.1 g/dL 7.4   Total Bilirubin 0.3 - 1.2 mg/dL 0.5   Alkaline Phos 38 - 126 U/L 54   AST 15 - 41 U/L 15   ALT 0 - 44 U/L 14       Latest Ref Rng & Units 01/21/2022   12:51 PM  CBC  WBC 4.0 - 10.5 K/uL 5.6   Hemoglobin 12.0 - 15.0 g/dL 12.1   Hematocrit 36.0 - 46.0 % 37.4   Platelets 150 - 400 K/uL 359      No images are attached to the encounter.  No results found.   Assessment and plan- Patient is a 36 y.o. female with iron deficiency anemia here for routine follow-up  Patient is not presently anemic and her hemoglobin improved from 10-12 after receiving IV iron.  Her B12 levels are low at 233 and I recommended over-the-counter B12 supplement 1000 mcg daily.  We will repeat CBC ferritin and iron studies in 3 in 6  months and I will see her back in 6 months.  We will also check B12 levels in 3 months.  Her blood pressure has been running high especially in the office with a systolic in the 979G and the diastolic in the 92J.  I have encouraged her to speak to her primary care provider about potentially starting a blood pressure medication.  Patient is focusing on holistic therapies to lower her blood pressure at this time   Visit Diagnosis 1. Other iron deficiency anemia   2. B12 deficiency      Dr. Randa Evens, MD, MPH [facility] [phone] 01/25/2022 3:49 PM

## 2022-01-25 NOTE — Progress Notes (Signed)
Pt states she has been feeling a lot better with IV irons. Will like to discuss her diastolic numbers being somewhat elevated.

## 2022-02-01 IMAGING — US US SOFT TISSUE HEAD/NECK
1 series · 13 of 16 positions shown · non-contrast
Comparison: No pertinent prior exams available for comparison.

CLINICAL DATA: Lymphadenopathy of head and neck. Lymphadenopathy of
head and neck. Rule out cyst on posterior neck. Additional history
provided by scanning technologist: Patient reports lump near right
base of skull.

EXAM:
ULTRASOUND OF HEAD/NECK SOFT TISSUES
TECHNIQUE: Ultrasound examination of the head/neck soft tissues was performed
in the area of clinical concern.

[Series 1: us soft tissue head/neck · 0.05mm/px · 13 of 16 slices shown]
[im 1/16]
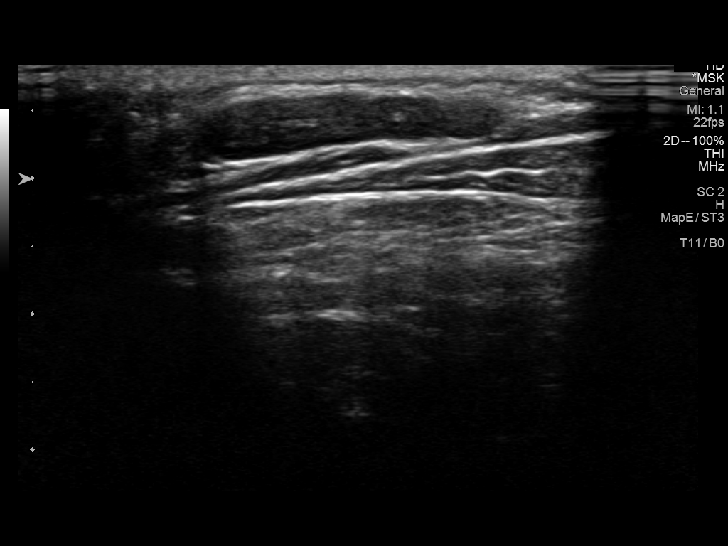
[im 2/16]
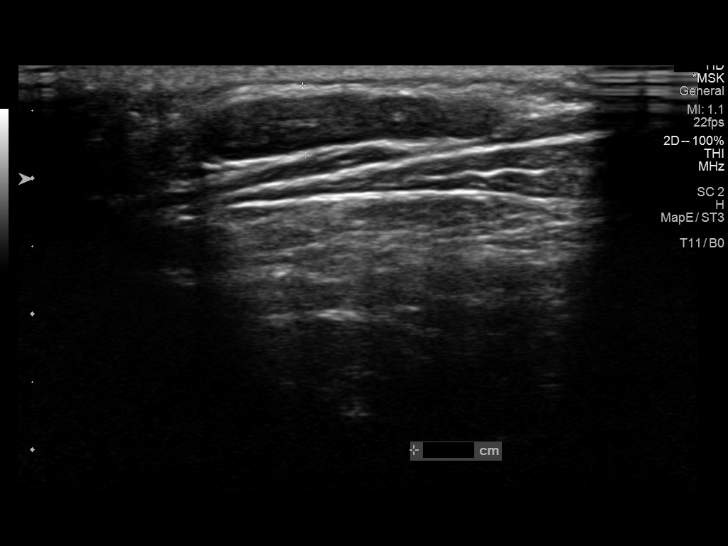
[im 4/16]
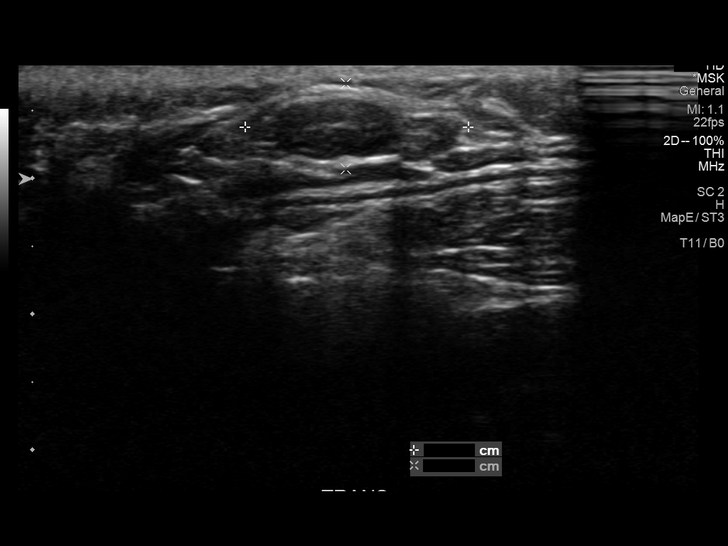
[im 5/16]
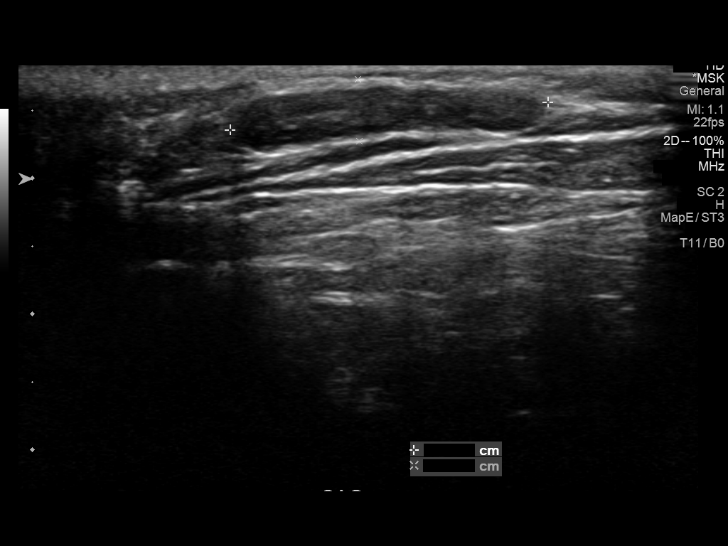
[im 6/16]
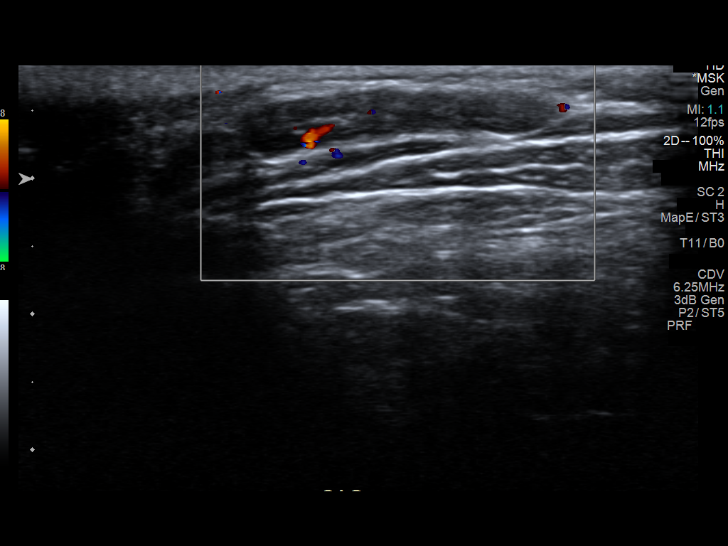
[im 7/16]
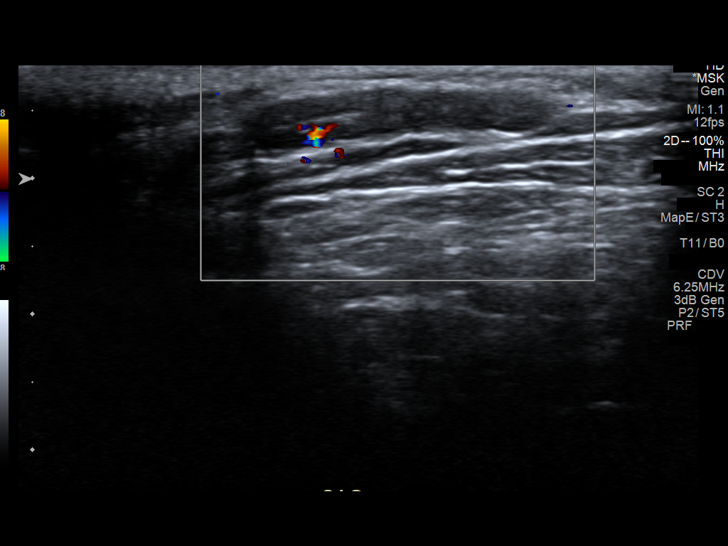
[im 9/16]
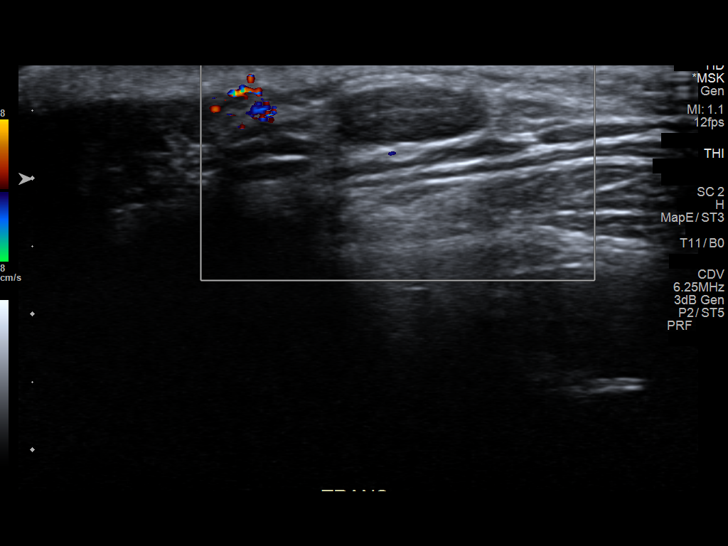
[im 10/16]
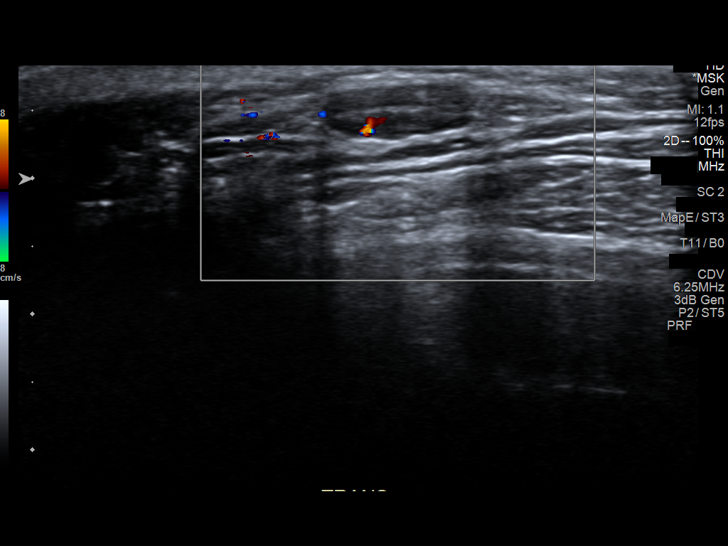
[im 11/16]
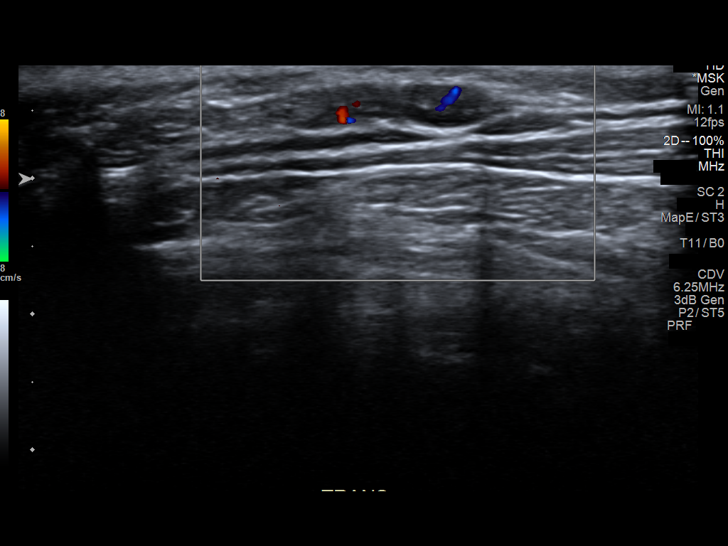
[im 12/16]
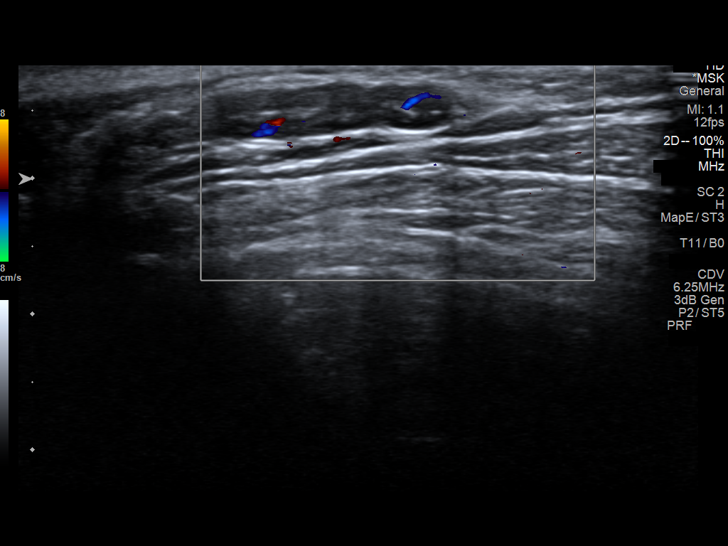
[im 13/16]
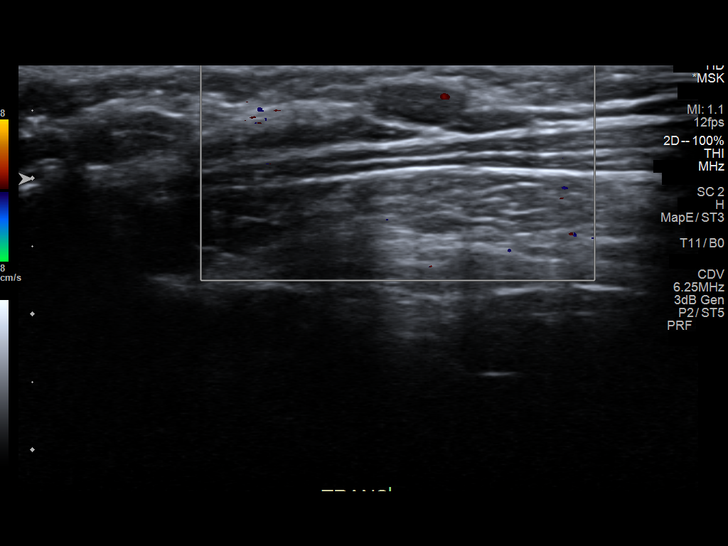
[im 15/16]
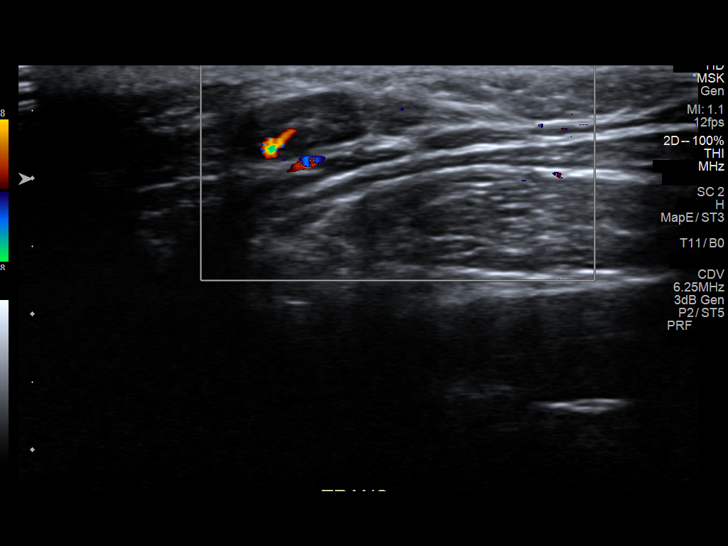
[im 16/16]
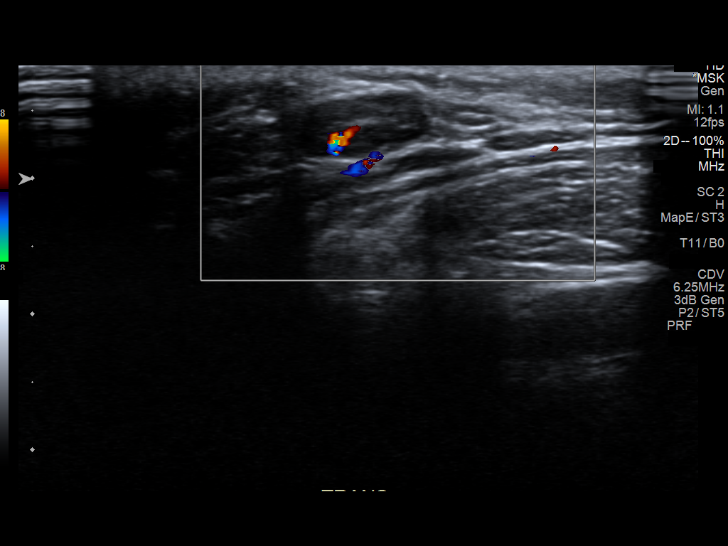

[13 of 16 positions shown; findings below may reference images not displayed]

FINDINGS: Targeted ultrasound was performed in the right upper posterior neck
region of concern. At this site, there is a 2.4 x 1.7 x 0.5 cm ovoid
soft tissue focus with internal color Doppler flow. Additionally,
there is internal hyperechogenicity suggestive of a fatty hilum.
Overall, the imaging features are most consistent with an enlarged
lymph node.

These results will be called to the ordering clinician or
representative by the Radiologist Assistant, and communication
documented in the PACS or [REDACTED].
IMPRESSION: Targeted ultrasound performed in the right upper posterior neck
region of concern. 2.4 x 1.7 x 0.5 cm ovoid soft tissue focus at
this site, as described and with sonographic imaging features most
consistent with a nonspecific enlarged lymph node. Clinical
correlation is recommended. Additionally, consider a
contrast-enhanced neck CT for further evaluation.

## 2022-03-04 ENCOUNTER — Other Ambulatory Visit: Payer: Self-pay

## 2022-03-04 ENCOUNTER — Inpatient Hospital Stay: Payer: No Typology Code available for payment source | Attending: Oncology

## 2022-03-04 DIAGNOSIS — D508 Other iron deficiency anemias: Secondary | ICD-10-CM

## 2022-03-04 DIAGNOSIS — E538 Deficiency of other specified B group vitamins: Secondary | ICD-10-CM | POA: Insufficient documentation

## 2022-03-04 DIAGNOSIS — D509 Iron deficiency anemia, unspecified: Secondary | ICD-10-CM | POA: Insufficient documentation

## 2022-03-04 LAB — FERRITIN: Ferritin: 75 ng/mL (ref 11–307)

## 2022-03-04 LAB — IRON AND TIBC
Iron: 78 ug/dL (ref 28–170)
Saturation Ratios: 19 % (ref 10.4–31.8)
TIBC: 406 ug/dL (ref 250–450)
UIBC: 328 ug/dL

## 2022-03-04 LAB — CBC WITH DIFFERENTIAL/PLATELET
Abs Immature Granulocytes: 0.01 10*3/uL (ref 0.00–0.07)
Basophils Absolute: 0.1 10*3/uL (ref 0.0–0.1)
Basophils Relative: 1 %
Eosinophils Absolute: 0.2 10*3/uL (ref 0.0–0.5)
Eosinophils Relative: 3 %
HCT: 40.6 % (ref 36.0–46.0)
Hemoglobin: 13.4 g/dL (ref 12.0–15.0)
Immature Granulocytes: 0 %
Lymphocytes Relative: 39 %
Lymphs Abs: 2.3 10*3/uL (ref 0.7–4.0)
MCH: 29 pg (ref 26.0–34.0)
MCHC: 33 g/dL (ref 30.0–36.0)
MCV: 87.9 fL (ref 80.0–100.0)
Monocytes Absolute: 0.5 10*3/uL (ref 0.1–1.0)
Monocytes Relative: 8 %
Neutro Abs: 2.9 10*3/uL (ref 1.7–7.7)
Neutrophils Relative %: 49 %
Platelets: 297 10*3/uL (ref 150–400)
RBC: 4.62 MIL/uL (ref 3.87–5.11)
RDW: 16.9 % — ABNORMAL HIGH (ref 11.5–15.5)
WBC: 5.9 10*3/uL (ref 4.0–10.5)
nRBC: 0 % (ref 0.0–0.2)

## 2022-03-08 ENCOUNTER — Other Ambulatory Visit: Payer: No Typology Code available for payment source

## 2022-03-11 ENCOUNTER — Other Ambulatory Visit: Payer: No Typology Code available for payment source

## 2022-04-27 ENCOUNTER — Inpatient Hospital Stay: Payer: No Typology Code available for payment source | Attending: Oncology

## 2022-04-27 DIAGNOSIS — D508 Other iron deficiency anemias: Secondary | ICD-10-CM

## 2022-04-27 DIAGNOSIS — E538 Deficiency of other specified B group vitamins: Secondary | ICD-10-CM | POA: Insufficient documentation

## 2022-04-27 DIAGNOSIS — D509 Iron deficiency anemia, unspecified: Secondary | ICD-10-CM | POA: Diagnosis present

## 2022-04-27 LAB — CBC WITH DIFFERENTIAL/PLATELET
Abs Immature Granulocytes: 0.01 10*3/uL (ref 0.00–0.07)
Basophils Absolute: 0.1 10*3/uL (ref 0.0–0.1)
Basophils Relative: 1 %
Eosinophils Absolute: 0.1 10*3/uL (ref 0.0–0.5)
Eosinophils Relative: 3 %
HCT: 41 % (ref 36.0–46.0)
Hemoglobin: 13.9 g/dL (ref 12.0–15.0)
Immature Granulocytes: 0 %
Lymphocytes Relative: 44 %
Lymphs Abs: 2.4 10*3/uL (ref 0.7–4.0)
MCH: 29.6 pg (ref 26.0–34.0)
MCHC: 33.9 g/dL (ref 30.0–36.0)
MCV: 87.2 fL (ref 80.0–100.0)
Monocytes Absolute: 0.6 10*3/uL (ref 0.1–1.0)
Monocytes Relative: 11 %
Neutro Abs: 2.3 10*3/uL (ref 1.7–7.7)
Neutrophils Relative %: 41 %
Platelets: 279 10*3/uL (ref 150–400)
RBC: 4.7 MIL/uL (ref 3.87–5.11)
RDW: 13.1 % (ref 11.5–15.5)
WBC: 5.6 10*3/uL (ref 4.0–10.5)
nRBC: 0 % (ref 0.0–0.2)

## 2022-04-27 LAB — IRON AND TIBC
Iron: 120 ug/dL (ref 28–170)
Saturation Ratios: 27 % (ref 10.4–31.8)
TIBC: 449 ug/dL (ref 250–450)
UIBC: 329 ug/dL

## 2022-04-27 LAB — VITAMIN B12: Vitamin B-12: 214 pg/mL (ref 180–914)

## 2022-04-27 LAB — FERRITIN: Ferritin: 48 ng/mL (ref 11–307)

## 2022-06-28 ENCOUNTER — Encounter: Payer: Self-pay | Admitting: Oncology

## 2022-07-03 ENCOUNTER — Encounter: Payer: Self-pay | Admitting: Oncology

## 2022-07-21 DIAGNOSIS — F4310 Post-traumatic stress disorder, unspecified: Secondary | ICD-10-CM | POA: Diagnosis not present

## 2022-07-25 ENCOUNTER — Other Ambulatory Visit: Payer: Self-pay | Admitting: *Deleted

## 2022-07-25 DIAGNOSIS — D649 Anemia, unspecified: Secondary | ICD-10-CM

## 2022-07-26 ENCOUNTER — Inpatient Hospital Stay: Payer: 59 | Attending: Oncology

## 2022-07-26 DIAGNOSIS — D649 Anemia, unspecified: Secondary | ICD-10-CM

## 2022-07-26 DIAGNOSIS — D519 Vitamin B12 deficiency anemia, unspecified: Secondary | ICD-10-CM | POA: Insufficient documentation

## 2022-07-26 LAB — IRON AND TIBC
Iron: 93 ug/dL (ref 28–170)
Saturation Ratios: 21 % (ref 10.4–31.8)
TIBC: 444 ug/dL (ref 250–450)
UIBC: 351 ug/dL

## 2022-07-26 LAB — FERRITIN: Ferritin: 21 ng/mL (ref 11–307)

## 2022-07-26 LAB — CBC
HCT: 37.5 % (ref 36.0–46.0)
Hemoglobin: 12.7 g/dL (ref 12.0–15.0)
MCH: 29.2 pg (ref 26.0–34.0)
MCHC: 33.9 g/dL (ref 30.0–36.0)
MCV: 86.2 fL (ref 80.0–100.0)
Platelets: 281 10*3/uL (ref 150–400)
RBC: 4.35 MIL/uL (ref 3.87–5.11)
RDW: 13.1 % (ref 11.5–15.5)
WBC: 6.6 10*3/uL (ref 4.0–10.5)
nRBC: 0 % (ref 0.0–0.2)

## 2022-07-28 ENCOUNTER — Other Ambulatory Visit: Payer: No Typology Code available for payment source

## 2022-07-28 ENCOUNTER — Encounter: Payer: Self-pay | Admitting: Oncology

## 2022-07-28 ENCOUNTER — Inpatient Hospital Stay (HOSPITAL_BASED_OUTPATIENT_CLINIC_OR_DEPARTMENT_OTHER): Payer: 59 | Admitting: Oncology

## 2022-07-28 VITALS — BP 127/88 | HR 76 | Temp 99.0°F | Ht 61.0 in | Wt 181.0 lb

## 2022-07-28 DIAGNOSIS — D649 Anemia, unspecified: Secondary | ICD-10-CM

## 2022-07-28 DIAGNOSIS — D519 Vitamin B12 deficiency anemia, unspecified: Secondary | ICD-10-CM | POA: Diagnosis not present

## 2022-08-04 DIAGNOSIS — F4310 Post-traumatic stress disorder, unspecified: Secondary | ICD-10-CM | POA: Diagnosis not present

## 2022-08-07 ENCOUNTER — Encounter: Payer: Self-pay | Admitting: Oncology

## 2022-08-07 NOTE — Progress Notes (Signed)
Hematology/Oncology Consult note [facility]  Telephone:([phone] [phone]  Patient Care Team: Marliss Coots, NP as PCP - General   Name of the patient: Lindsey Zamora  [id number]  1985/08/29   Date of visit: 08/07/22  Diagnosis-iron and B12 deficiency anemia  Chief complaint/ Reason for visit-routine follow-up of anemia  Heme/Onc history: patient is a 37 year old female referred for iron deficiency anemia.She has a history of adenomyosis for which she had IUD in place but was recently taken out.  She reports that her menstrual cycles last for about 4 to 5 days and the first few days are particularly heavy.  Her recent labs from 11/26/2021 showed elevated TIBC of 457, iron saturation of 7% and total iron was low at 30.  White count normal at 5.2, H&H of 10.7/34.2 with an MCV of 82.4 and a platelet count of 317.  No family history of colon cancer.  Denies any blood loss in her stool or urine.  Denies any consistent use of NSAIDs.   Patient received 5 doses of Venofer in June 2023.    Interval history- She is doing well overall and denies any specific complaints at this time.  Menstrual cycles are regular.  ECOG PS- 0 Pain scale- 0   Review of systems- Review of Systems  Constitutional:  Positive for malaise/fatigue. Negative for chills, fever and weight loss.  HENT:  Negative for congestion, ear discharge and nosebleeds.   Eyes:  Negative for blurred vision.  Respiratory:  Negative for cough, hemoptysis, sputum production, shortness of breath and wheezing.   Cardiovascular:  Negative for chest pain, palpitations, orthopnea and claudication.  Gastrointestinal:  Negative for abdominal pain, blood in stool, constipation, diarrhea, heartburn, melena, nausea and vomiting.  Genitourinary:  Negative for dysuria, flank pain, frequency, hematuria and urgency.  Musculoskeletal:  Negative for back pain, joint pain and myalgias.  Skin:  Negative for  rash.  Neurological:  Negative for dizziness, tingling, focal weakness, seizures, weakness and headaches.  Endo/Heme/Allergies:  Does not bruise/bleed easily.  Psychiatric/Behavioral:  Negative for depression and suicidal ideas. The patient does not have insomnia.       No Known Allergies   Past Medical History:  Diagnosis Date   Anemia      History reviewed. No pertinent surgical history.  Social History   Socioeconomic History   Marital status: Single    Spouse name: Not on file   Number of children: 2   Years of education: Not on file   Highest education level: 12th grade  Occupational History   Not on file  Tobacco Use   Smoking status: Never   Smokeless tobacco: Never  Vaping Use   Vaping Use: Never used  Substance and Sexual Activity   Alcohol use: Yes    Alcohol/week: 3.0 standard drinks of alcohol    Types: 3 Glasses of wine per week   Drug use: No   Sexual activity: Not Currently    Partners: Male    Birth control/protection: None, I.U.D.  Other Topics Concern   Not on file  Social History Narrative   Not on file   Social Determinants of Health   Financial Resource Strain: Not on file  Food Insecurity: Not on file  Transportation Needs: Not on file  Physical Activity: Not on file  Stress: Not on file  Social Connections: Not on file  Intimate Partner Violence: Not on file    Family History  Problem Relation Age of Onset  Congestive Heart Failure Mother    Hypertension Mother    Lupus Mother    Anemia Mother    Breast cancer Maternal Grandmother    Hypertension Maternal Grandmother      Current Outpatient Medications:    cyanocobalamin 1000 MCG tablet, Take 1,000 mcg by mouth daily., Disp: , Rfl:    Vitamin D, Ergocalciferol, (DRISDOL) 1.25 MG (50000 UNIT) CAPS capsule, Take 50,000 Units by mouth once a week. (Patient not taking: Reported on 12/08/2021), Disp: , Rfl:   Physical exam:  Vitals:   07/28/22 1533  BP: 127/88  Pulse: 76   Temp: 99 F (37.2 C)  TempSrc: Tympanic  SpO2: 99%  Weight: 181 lb (82.1 kg)  Height: 5\' 1"  (1.549 m)   Physical Exam Cardiovascular:     Rate and Rhythm: Normal rate and regular rhythm.     Heart sounds: Normal heart sounds.  Pulmonary:     Effort: Pulmonary effort is normal.  Skin:    General: Skin is warm and dry.  Neurological:     Mental Status: She is alert and oriented to person, place, and time.         Latest Ref Rng & Units 12/24/2021    9:30 AM  CMP  Glucose 70 - 99 mg/dL 84   BUN 6 - 20 mg/dL 10   Creatinine 0.44 - 1.00 mg/dL 0.81   Sodium 135 - 145 mmol/L 136   Potassium 3.5 - 5.1 mmol/L 3.7   Chloride 98 - 111 mmol/L 105   CO2 22 - 32 mmol/L 24   Calcium 8.9 - 10.3 mg/dL 8.6   Total Protein 6.5 - 8.1 g/dL 7.4   Total Bilirubin 0.3 - 1.2 mg/dL 0.5   Alkaline Phos 38 - 126 U/L 54   AST 15 - 41 U/L 15   ALT 0 - 44 U/L 14       Latest Ref Rng & Units 07/26/2022   11:48 AM  CBC  WBC 4.0 - 10.5 K/uL 6.6   Hemoglobin 12.0 - 15.0 g/dL 12.7   Hematocrit 36.0 - 46.0 % 37.5   Platelets 150 - 400 K/uL 281     No images are attached to the encounter.  No results found.   Assessment and plan- Patient is a 37 y.o. female for routine follow-up of iron andB12 deficiency anemia  Patient is not presently anemic with a hemoglobin of 12.7 and a hematocrit of 37.5.  Ferritin levels are still low at 21 with an iron saturation of 21%.  We discussed doing IV iron again versus continuing oral iron.  Patient would like to try oral iron.  Her B12 levels were low back in October 2023 at 214 and have asked her to take oral B12 1000 mcg daily.  I will repeat CBC ferritin iron studies and B12 levels in 3 and 6 months and see her back in 6 months  I will also consider GI referral if iron studies do not improve.  Patient reports that her menstrual cycles are not particularly heavy.  She does not report any symptoms of GI bleeding   Visit Diagnosis 1. Anemia, unspecified type       Dr. Randa Evens, MD, MPH [facility] [phone] 08/07/2022 5:01 PM

## 2022-08-18 DIAGNOSIS — F4310 Post-traumatic stress disorder, unspecified: Secondary | ICD-10-CM | POA: Diagnosis not present

## 2022-09-06 DIAGNOSIS — F4310 Post-traumatic stress disorder, unspecified: Secondary | ICD-10-CM | POA: Diagnosis not present

## 2022-09-20 DIAGNOSIS — F4310 Post-traumatic stress disorder, unspecified: Secondary | ICD-10-CM | POA: Diagnosis not present

## 2022-10-04 DIAGNOSIS — F4310 Post-traumatic stress disorder, unspecified: Secondary | ICD-10-CM | POA: Diagnosis not present

## 2022-10-11 ENCOUNTER — Inpatient Hospital Stay: Payer: 59 | Attending: Oncology

## 2022-10-11 ENCOUNTER — Encounter: Payer: Self-pay | Admitting: *Deleted

## 2022-10-11 ENCOUNTER — Other Ambulatory Visit: Payer: Self-pay | Admitting: *Deleted

## 2022-10-11 DIAGNOSIS — R5383 Other fatigue: Secondary | ICD-10-CM

## 2022-10-11 DIAGNOSIS — D649 Anemia, unspecified: Secondary | ICD-10-CM

## 2022-10-11 DIAGNOSIS — D519 Vitamin B12 deficiency anemia, unspecified: Secondary | ICD-10-CM | POA: Diagnosis not present

## 2022-10-11 DIAGNOSIS — D509 Iron deficiency anemia, unspecified: Secondary | ICD-10-CM | POA: Insufficient documentation

## 2022-10-11 LAB — CBC
HCT: 38.9 % (ref 36.0–46.0)
Hemoglobin: 12.8 g/dL (ref 12.0–15.0)
MCH: 28.7 pg (ref 26.0–34.0)
MCHC: 32.9 g/dL (ref 30.0–36.0)
MCV: 87.2 fL (ref 80.0–100.0)
Platelets: 394 10*3/uL (ref 150–400)
RBC: 4.46 MIL/uL (ref 3.87–5.11)
RDW: 13.1 % (ref 11.5–15.5)
WBC: 5.5 10*3/uL (ref 4.0–10.5)
nRBC: 0 % (ref 0.0–0.2)

## 2022-10-11 LAB — BASIC METABOLIC PANEL
Anion gap: 6 (ref 5–15)
BUN: 12 mg/dL (ref 6–20)
CO2: 24 mmol/L (ref 22–32)
Calcium: 9.2 mg/dL (ref 8.9–10.3)
Chloride: 103 mmol/L (ref 98–111)
Creatinine, Ser: 0.92 mg/dL (ref 0.44–1.00)
GFR, Estimated: >60 mL/min (ref >60)
Glucose, Bld: 82 mg/dL (ref 70–99)
Potassium: 4.1 mmol/L (ref 3.5–5.1)
Sodium: 133 mmol/L — ABNORMAL LOW (ref 135–145)

## 2022-10-11 LAB — IRON AND TIBC
Iron: 70 ug/dL (ref 28–170)
Saturation Ratios: 14 % (ref 10.4–31.8)
TIBC: 487 ug/dL — ABNORMAL HIGH (ref 250–450)
UIBC: 417 ug/dL

## 2022-10-11 LAB — FERRITIN: Ferritin: 10 ng/mL — ABNORMAL LOW (ref 11–307)

## 2022-10-11 LAB — VITAMIN B12: Vitamin B-12: 276 pg/mL (ref 180–914)

## 2022-10-11 LAB — TSH: TSH: 1.401 u[IU]/mL (ref 0.350–4.500)

## 2022-10-12 ENCOUNTER — Telehealth: Payer: Self-pay | Admitting: *Deleted

## 2022-10-12 ENCOUNTER — Other Ambulatory Visit: Payer: Self-pay | Admitting: Oncology

## 2022-10-12 NOTE — Telephone Encounter (Signed)
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 Janese Banks said that she can get iron if she wants it. The last time that Janese Banks spoke to pt. She had told Dr. Janese Banks that her menstrual period is usually not bad. Malanii was going to think about and then let me know and then I told her that she had b12 level lower than 300 and Janese Banks wanted to see if she wants to start that also. Later in the afternoon she told me that she did want the IV iron and the b12 inj. I sent that to Janese Banks and Jinny Blossom checked to see what kind insurance covers and then set her up. She can have venofer and , b12 inj are ok. She was given the appts and I sent message to Janese Banks to put in orders for the pt.

## 2022-10-13 ENCOUNTER — Inpatient Hospital Stay: Payer: 59

## 2022-10-13 VITALS — BP 136/95 | HR 72 | Temp 98.3°F | Resp 16

## 2022-10-13 DIAGNOSIS — D519 Vitamin B12 deficiency anemia, unspecified: Secondary | ICD-10-CM | POA: Diagnosis not present

## 2022-10-13 DIAGNOSIS — D508 Other iron deficiency anemias: Secondary | ICD-10-CM

## 2022-10-13 DIAGNOSIS — D509 Iron deficiency anemia, unspecified: Secondary | ICD-10-CM | POA: Diagnosis not present

## 2022-10-13 MED ORDER — SODIUM CHLORIDE 0.9 % IV SOLN
Freq: Once | INTRAVENOUS | Status: AC
  Filled 2022-10-13: qty 250

## 2022-10-13 MED ORDER — CYANOCOBALAMIN 1000 MCG/ML IJ SOLN
1000.0000 ug | INTRAMUSCULAR | Status: DC
  Administered 2022-10-13: 1000 ug via INTRAMUSCULAR
  Filled 2022-10-13: qty 1

## 2022-10-13 MED ORDER — SODIUM CHLORIDE 0.9 % IV SOLN
200.0000 mg | INTRAVENOUS | Status: DC
  Administered 2022-10-13: 200 mg via INTRAVENOUS
  Filled 2022-10-13: qty 10

## 2022-10-15 ENCOUNTER — Other Ambulatory Visit: Payer: 59

## 2022-10-15 MED FILL — Iron Sucrose Inj 20 MG/ML (Fe Equiv): INTRAVENOUS | Qty: 10 | Status: AC

## 2022-10-18 ENCOUNTER — Inpatient Hospital Stay: Payer: 59 | Attending: Oncology

## 2022-10-18 VITALS — BP 125/86 | HR 70 | Temp 98.6°F | Resp 16

## 2022-10-18 DIAGNOSIS — D509 Iron deficiency anemia, unspecified: Secondary | ICD-10-CM | POA: Insufficient documentation

## 2022-10-18 DIAGNOSIS — D508 Other iron deficiency anemias: Secondary | ICD-10-CM

## 2022-10-18 DIAGNOSIS — F4310 Post-traumatic stress disorder, unspecified: Secondary | ICD-10-CM | POA: Diagnosis not present

## 2022-10-18 MED ORDER — SODIUM CHLORIDE 0.9 % IV SOLN
Freq: Once | INTRAVENOUS | Status: AC
  Filled 2022-10-18: qty 250

## 2022-10-18 MED ORDER — SODIUM CHLORIDE 0.9 % IV SOLN
200.0000 mg | INTRAVENOUS | Status: DC
  Administered 2022-10-18: 200 mg via INTRAVENOUS
  Filled 2022-10-18: qty 200

## 2022-10-18 NOTE — Progress Notes (Signed)
Pt tolerated treatment without complaints.  VSS.  Pt refused 30 minute post observation period.

## 2022-10-18 NOTE — Patient Instructions (Signed)
[facility]  Discharge Instructions: Thank you for choosing [facility] to provide your oncology and hematology care.  If you have a lab appointment with the [facility], please go directly to the [facility] and check in at the registration area.  Wear comfortable clothing and clothing appropriate for easy access to any Portacath or PICC line.   We strive to give you quality time with your provider. You may need to reschedule your appointment if you arrive late (15 or more minutes).  Arriving late affects you and other patients whose appointments are after yours.  Also, if you miss three or more appointments without notifying the office, you may be dismissed from the clinic at the provider's discretion.      For prescription refill requests, have your pharmacy contact our office and allow 72 hours for refills to be completed.    Today you received the following chemotherapy and/or immunotherapy agents VENOFER      To help prevent nausea and vomiting after your treatment, we encourage you to take your nausea medication as directed.  BELOW ARE SYMPTOMS THAT SHOULD BE REPORTED IMMEDIATELY: *FEVER GREATER THAN 100.4 F (38 C) OR HIGHER *CHILLS OR SWEATING *NAUSEA AND VOMITING THAT IS NOT CONTROLLED WITH YOUR NAUSEA MEDICATION *UNUSUAL SHORTNESS OF BREATH *UNUSUAL BRUISING OR BLEEDING *URINARY PROBLEMS (pain or burning when urinating, or frequent urination) *BOWEL PROBLEMS (unusual diarrhea, constipation, pain near the anus) TENDERNESS IN MOUTH AND THROAT WITH OR WITHOUT PRESENCE OF ULCERS (sore throat, sores in mouth, or a toothache) UNUSUAL RASH, SWELLING OR PAIN  UNUSUAL VAGINAL DISCHARGE OR ITCHING   Items with * indicate a potential emergency and should be followed up as soon as possible or go to the Emergency Department if any problems should occur.  Please show the CHEMOTHERAPY ALERT CARD or IMMUNOTHERAPY ALERT CARD at check-in to  the Emergency Department and triage nurse.  Should you have questions after your visit or need to cancel or reschedule your appointment, please contact [facility]  [phone] and follow the prompts.  Office hours are 8:00 a.m. to 4:30 p.m. Monday - Friday. Please note that voicemails left after 4:00 p.m. may not be returned until the following business day.  We are closed weekends and major holidays. You have access to a nurse at all times for urgent questions. Please call the main number to the clinic [phone] and follow the prompts.  For any non-urgent questions, you may also contact your provider using MyChart. We now offer e-Visits for anyone 36 and older to request care online for non-urgent symptoms. For details visit [email or web address].   Also download the MyChart app! Go to the app store, search "MyChart", open the app, select [facility], and log in with your MyChart username and password.  Masks are optional in the cancer centers. If you would like for your care team to wear a mask while they are taking care of you, please let them know. For doctor visits, patients may have with them one support person who is at least 36 years old. At this time, visitors are not allowed in the infusion area.  Iron Sucrose Injection What is this medication? IRON SUCROSE (EYE ern SOO krose) treats low levels of iron (iron deficiency anemia) in people with kidney disease. Iron is a mineral that plays an important role in making red blood cells, which carry oxygen from your lungs to the rest of your body. This medicine  may be used for other purposes; ask your health care provider or pharmacist if you have questions. COMMON BRAND NAME(S): Venofer What should I tell my care team before I take this medication? They need to know if you have any of these conditions: Anemia not caused by low iron levels Heart disease High levels of iron in the blood Kidney  disease Liver disease An unusual or allergic reaction to iron, other medications, foods, dyes, or preservatives Pregnant or trying to get pregnant Breast-feeding How should I use this medication? This medication is for infusion into a vein. It is given in a hospital or clinic setting. Talk to your care team about the use of this medication in children. While this medication may be prescribed for children as young as 2 years for selected conditions, precautions do apply. Overdosage: If you think you have taken too much of this medicine contact a poison control center or emergency room at once. NOTE: This medicine is only for you. Do not share this medicine with others. What if I miss a dose? It is important not to miss your dose. Call your care team if you are unable to keep an appointment. What may interact with this medication? Do not take this medication with any of the following: Deferoxamine Dimercaprol Other iron products This medication may also interact with the following: Chloramphenicol Deferasirox This list may not describe all possible interactions. Give your health care provider a list of all the medicines, herbs, non-prescription drugs, or dietary supplements you use. Also tell them if you smoke, drink alcohol, or use illegal drugs. Some items may interact with your medicine. What should I watch for while using this medication? Visit your care team regularly. Tell your care team if your symptoms do not start to get better or if they get worse. You may need blood work done while you are taking this medication. You may need to follow a special diet. Talk to your care team. Foods that contain iron include: whole grains/cereals, dried fruits, beans, or peas, leafy green vegetables, and organ meats (liver, kidney). What side effects may I notice from receiving this medication? Side effects that you should report to your care team as soon as possible: Allergic reactions--skin rash,  itching, hives, swelling of the face, lips, tongue, or throat Low blood pressure--dizziness, feeling faint or lightheaded, blurry vision Shortness of breath Side effects that usually do not require medical attention (report to your care team if they continue or are bothersome): Flushing Headache Joint pain Muscle pain Nausea Pain, redness, or irritation at injection site This list may not describe all possible side effects. Call your doctor for medical advice about side effects. You may report side effects to FDA at 1-800-FDA-1088. Where should I keep my medication? This medication is given in a hospital or clinic and will not be stored at home. NOTE: This sheet is a summary. It may not cover all possible information. If you have questions about this medicine, talk to your doctor, pharmacist, or health care provider.  2023 Elsevier/Gold Standard (2020-11-28 00:00:00)

## 2022-10-19 MED FILL — Iron Sucrose Inj 20 MG/ML (Fe Equiv): INTRAVENOUS | Qty: 10 | Status: AC

## 2022-10-20 ENCOUNTER — Inpatient Hospital Stay: Payer: 59

## 2022-10-20 VITALS — BP 119/85 | HR 79 | Temp 97.7°F | Resp 18

## 2022-10-20 DIAGNOSIS — D508 Other iron deficiency anemias: Secondary | ICD-10-CM

## 2022-10-20 DIAGNOSIS — D509 Iron deficiency anemia, unspecified: Secondary | ICD-10-CM | POA: Diagnosis not present

## 2022-10-20 MED ORDER — SODIUM CHLORIDE 0.9 % IV SOLN
200.0000 mg | INTRAVENOUS | Status: DC
  Administered 2022-10-20: 200 mg via INTRAVENOUS
  Filled 2022-10-20: qty 10

## 2022-10-20 MED ORDER — SODIUM CHLORIDE 0.9 % IV SOLN
Freq: Once | INTRAVENOUS | Status: AC
  Filled 2022-10-20: qty 250

## 2022-10-20 NOTE — Patient Instructions (Signed)

## 2022-10-22 ENCOUNTER — Inpatient Hospital Stay: Payer: 59

## 2022-10-22 VITALS — BP 127/83 | HR 71 | Temp 98.7°F | Resp 18

## 2022-10-22 DIAGNOSIS — D508 Other iron deficiency anemias: Secondary | ICD-10-CM

## 2022-10-22 DIAGNOSIS — D509 Iron deficiency anemia, unspecified: Secondary | ICD-10-CM | POA: Diagnosis not present

## 2022-10-22 MED ORDER — SODIUM CHLORIDE 0.9 % IV SOLN
200.0000 mg | INTRAVENOUS | Status: DC
  Administered 2022-10-22: 200 mg via INTRAVENOUS
  Filled 2022-10-22: qty 200

## 2022-10-22 MED ORDER — SODIUM CHLORIDE 0.9 % IV SOLN
Freq: Once | INTRAVENOUS | Status: AC
  Filled 2022-10-22: qty 250

## 2022-10-25 ENCOUNTER — Inpatient Hospital Stay: Payer: 59

## 2022-11-01 ENCOUNTER — Inpatient Hospital Stay: Payer: 59

## 2022-11-01 VITALS — BP 111/77 | HR 71 | Temp 98.3°F | Resp 16

## 2022-11-01 DIAGNOSIS — D509 Iron deficiency anemia, unspecified: Secondary | ICD-10-CM | POA: Diagnosis not present

## 2022-11-01 DIAGNOSIS — D508 Other iron deficiency anemias: Secondary | ICD-10-CM

## 2022-11-01 MED ORDER — SODIUM CHLORIDE 0.9 % IV SOLN
Freq: Once | INTRAVENOUS | Status: AC
  Filled 2022-11-01: qty 250

## 2022-11-01 MED ORDER — SODIUM CHLORIDE 0.9 % IV SOLN
200.0000 mg | INTRAVENOUS | Status: DC
  Administered 2022-11-01: 200 mg via INTRAVENOUS
  Filled 2022-11-01: qty 200

## 2022-11-01 NOTE — Patient Instructions (Signed)

## 2022-11-01 NOTE — Progress Notes (Signed)
Declined 30 minute post-observation. Vitals stable at discharge.

## 2022-11-12 ENCOUNTER — Inpatient Hospital Stay: Payer: 59

## 2022-11-23 DIAGNOSIS — F4310 Post-traumatic stress disorder, unspecified: Secondary | ICD-10-CM | POA: Diagnosis not present

## 2022-12-09 DIAGNOSIS — F4310 Post-traumatic stress disorder, unspecified: Secondary | ICD-10-CM | POA: Diagnosis not present

## 2022-12-14 DIAGNOSIS — F4310 Post-traumatic stress disorder, unspecified: Secondary | ICD-10-CM | POA: Diagnosis not present

## 2022-12-17 ENCOUNTER — Inpatient Hospital Stay: Payer: 59 | Attending: Oncology

## 2022-12-30 DIAGNOSIS — F4310 Post-traumatic stress disorder, unspecified: Secondary | ICD-10-CM | POA: Diagnosis not present

## 2023-01-07 DIAGNOSIS — Z130 Encounter for screening for diseases of the blood and blood-forming organs and certain disorders involving the immune mechanism: Secondary | ICD-10-CM | POA: Diagnosis not present

## 2023-01-07 DIAGNOSIS — N920 Excessive and frequent menstruation with regular cycle: Secondary | ICD-10-CM | POA: Diagnosis not present

## 2023-01-07 DIAGNOSIS — Z01411 Encounter for gynecological examination (general) (routine) with abnormal findings: Secondary | ICD-10-CM | POA: Diagnosis not present

## 2023-01-07 DIAGNOSIS — N644 Mastodynia: Secondary | ICD-10-CM | POA: Diagnosis not present

## 2023-01-07 DIAGNOSIS — Z1389 Encounter for screening for other disorder: Secondary | ICD-10-CM | POA: Diagnosis not present

## 2023-01-11 ENCOUNTER — Other Ambulatory Visit: Payer: Self-pay | Admitting: Obstetrics and Gynecology

## 2023-01-11 DIAGNOSIS — N644 Mastodynia: Secondary | ICD-10-CM

## 2023-01-14 ENCOUNTER — Inpatient Hospital Stay: Payer: 59

## 2023-01-26 ENCOUNTER — Other Ambulatory Visit: Payer: 59

## 2023-01-27 DIAGNOSIS — F4310 Post-traumatic stress disorder, unspecified: Secondary | ICD-10-CM | POA: Diagnosis not present

## 2023-01-28 ENCOUNTER — Ambulatory Visit: Payer: 59 | Admitting: Oncology

## 2023-02-11 ENCOUNTER — Inpatient Hospital Stay: Payer: 59

## 2023-02-16 DIAGNOSIS — F4310 Post-traumatic stress disorder, unspecified: Secondary | ICD-10-CM | POA: Diagnosis not present

## 2023-02-21 DIAGNOSIS — F4310 Post-traumatic stress disorder, unspecified: Secondary | ICD-10-CM | POA: Diagnosis not present

## 2023-03-02 DIAGNOSIS — F4310 Post-traumatic stress disorder, unspecified: Secondary | ICD-10-CM | POA: Diagnosis not present

## 2023-03-07 ENCOUNTER — Ambulatory Visit: Admission: RE | Admit: 2023-03-07 | Payer: 59 | Source: Ambulatory Visit

## 2023-03-07 ENCOUNTER — Ambulatory Visit: Payer: 59

## 2023-03-07 DIAGNOSIS — F4310 Post-traumatic stress disorder, unspecified: Secondary | ICD-10-CM | POA: Diagnosis not present

## 2023-03-07 DIAGNOSIS — N644 Mastodynia: Secondary | ICD-10-CM | POA: Diagnosis not present

## 2023-03-23 DIAGNOSIS — F4310 Post-traumatic stress disorder, unspecified: Secondary | ICD-10-CM | POA: Diagnosis not present

## 2023-03-30 DIAGNOSIS — F4310 Post-traumatic stress disorder, unspecified: Secondary | ICD-10-CM | POA: Diagnosis not present

## 2023-04-04 DIAGNOSIS — F4310 Post-traumatic stress disorder, unspecified: Secondary | ICD-10-CM | POA: Diagnosis not present

## 2023-05-09 DIAGNOSIS — F4310 Post-traumatic stress disorder, unspecified: Secondary | ICD-10-CM | POA: Diagnosis not present

## 2023-05-25 DIAGNOSIS — F4310 Post-traumatic stress disorder, unspecified: Secondary | ICD-10-CM | POA: Diagnosis not present

## 2023-06-02 DIAGNOSIS — F4310 Post-traumatic stress disorder, unspecified: Secondary | ICD-10-CM | POA: Diagnosis not present

## 2023-08-04 DIAGNOSIS — E669 Obesity, unspecified: Secondary | ICD-10-CM | POA: Diagnosis not present

## 2023-08-04 DIAGNOSIS — Z131 Encounter for screening for diabetes mellitus: Secondary | ICD-10-CM | POA: Diagnosis not present

## 2023-08-04 DIAGNOSIS — Z13220 Encounter for screening for lipoid disorders: Secondary | ICD-10-CM | POA: Diagnosis not present

## 2023-08-04 DIAGNOSIS — D508 Other iron deficiency anemias: Secondary | ICD-10-CM | POA: Diagnosis not present

## 2023-08-04 DIAGNOSIS — J302 Other seasonal allergic rhinitis: Secondary | ICD-10-CM | POA: Diagnosis not present

## 2023-08-04 DIAGNOSIS — N920 Excessive and frequent menstruation with regular cycle: Secondary | ICD-10-CM | POA: Diagnosis not present

## 2023-08-04 DIAGNOSIS — R030 Elevated blood-pressure reading, without diagnosis of hypertension: Secondary | ICD-10-CM | POA: Diagnosis not present

## 2023-08-04 DIAGNOSIS — E559 Vitamin D deficiency, unspecified: Secondary | ICD-10-CM | POA: Diagnosis not present

## 2023-08-04 DIAGNOSIS — Z0000 Encounter for general adult medical examination without abnormal findings: Secondary | ICD-10-CM | POA: Diagnosis not present

## 2023-08-09 DIAGNOSIS — R768 Other specified abnormal immunological findings in serum: Secondary | ICD-10-CM | POA: Diagnosis not present

## 2023-08-09 DIAGNOSIS — E559 Vitamin D deficiency, unspecified: Secondary | ICD-10-CM | POA: Diagnosis not present

## 2023-08-09 DIAGNOSIS — R7989 Other specified abnormal findings of blood chemistry: Secondary | ICD-10-CM | POA: Diagnosis not present

## 2023-08-09 DIAGNOSIS — I10 Essential (primary) hypertension: Secondary | ICD-10-CM | POA: Diagnosis not present

## 2023-08-09 DIAGNOSIS — J302 Other seasonal allergic rhinitis: Secondary | ICD-10-CM | POA: Diagnosis not present

## 2023-08-09 DIAGNOSIS — D508 Other iron deficiency anemias: Secondary | ICD-10-CM | POA: Diagnosis not present

## 2023-08-09 DIAGNOSIS — N920 Excessive and frequent menstruation with regular cycle: Secondary | ICD-10-CM | POA: Diagnosis not present

## 2023-08-09 DIAGNOSIS — E669 Obesity, unspecified: Secondary | ICD-10-CM | POA: Diagnosis not present

## 2023-08-16 ENCOUNTER — Encounter: Payer: Self-pay | Admitting: Oncology

## 2023-08-17 DIAGNOSIS — F4310 Post-traumatic stress disorder, unspecified: Secondary | ICD-10-CM | POA: Diagnosis not present

## 2023-12-26 DIAGNOSIS — L818 Other specified disorders of pigmentation: Secondary | ICD-10-CM | POA: Diagnosis not present

## 2023-12-26 DIAGNOSIS — M2041 Other hammer toe(s) (acquired), right foot: Secondary | ICD-10-CM | POA: Diagnosis not present

## 2023-12-26 DIAGNOSIS — M2042 Other hammer toe(s) (acquired), left foot: Secondary | ICD-10-CM | POA: Diagnosis not present

## 2024-01-03 ENCOUNTER — Encounter: Payer: Self-pay | Admitting: Internal Medicine

## 2024-01-03 ENCOUNTER — Ambulatory Visit: Payer: 59 | Attending: Internal Medicine | Admitting: Internal Medicine

## 2024-01-03 VITALS — BP 132/85 | HR 59 | Resp 14 | Ht 61.0 in | Wt 177.0 lb

## 2024-01-03 DIAGNOSIS — E559 Vitamin D deficiency, unspecified: Secondary | ICD-10-CM

## 2024-01-03 DIAGNOSIS — M222X1 Patellofemoral disorders, right knee: Secondary | ICD-10-CM

## 2024-01-03 DIAGNOSIS — R768 Other specified abnormal immunological findings in serum: Secondary | ICD-10-CM

## 2024-01-03 DIAGNOSIS — M222X2 Patellofemoral disorders, left knee: Secondary | ICD-10-CM | POA: Diagnosis not present

## 2024-01-03 DIAGNOSIS — R252 Cramp and spasm: Secondary | ICD-10-CM | POA: Diagnosis not present

## 2024-01-03 NOTE — Progress Notes (Signed)
 Office Visit Note  Patient: Lindsey Zamora             Date of Birth: 1986/06/30           MRN: [id number]             PCP: Rosalea Rosina SAILOR, PA Referring: Rosalea Rosina SAILOR, [place] Visit Date: 01/03/2024   Subjective:  New Patient (Initial Visit) (Patient states she has joint pain in both of her knees and sometimes her arms. Patient states her feet have been cramping a lot. Patient states she has a rash on her left wrist. )    Discussed the use of AI scribe software for clinical note transcription with the patient, who gave verbal consent to proceed.  History of Present Illness   Lindsey Zamora is a 38 year old female who presents with joint pain and a positive ANA test.   She experiences chronic bilateral knee pain, more pronounced at night and not associated with walking. She works on hard floors, which she suspects may contribute to the pain. There is no significant swelling, but she cannot easily locate her kneecap.  She experiences severe cramps in her toes, primarily in the evening after work. The cramps are persistent and do not subside quickly. She tries to alleviate the pain by walking and flexing her feet. She has not tried any treatments or supplements for the cramping.  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 She is unsure if this contributes to her symptoms.  She developed a rash over a week ago, which is itchy and has been treated with triple antibiotic ointment. No other rashes, mouth sores, or nose sores are present.  She reports a history of swollen lymph nodes last week, which have since resolved. No recent serious illnesses, flu, or COVID. She experienced excessive mucus production for a couple of weeks, which has since resolved. No associated chest pain.  She has experienced intermittent flank pain but has no history of kidney stones or urinary tract infections. Previous urine tests showed some concern for kidney function, but  follow-up tests were reportedly normal.  Her mother has lupus, skin and joint problems, congestive heart failure, and kidney issues. 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       Labs reviewed ANA 1:40 speckled  Activities of Daily Living:  Patient reports morning stiffness for 0 minute.   Patient Reports nocturnal pain.  Difficulty dressing/grooming: Denies Difficulty climbing stairs: Denies Difficulty getting out of chair: Denies Difficulty using hands for taps, buttons, cutlery, and/or writing: Denies  Review of Systems  Constitutional:  Negative for fatigue.  HENT:  Negative for mouth sores and mouth dryness.   Eyes:  Negative for dryness.  Respiratory:  Positive for shortness of breath.   Cardiovascular:  Positive for palpitations. Negative for chest pain.  Gastrointestinal:  Negative for blood in stool, constipation and diarrhea.  Endocrine: Negative for increased urination.  Genitourinary:  Negative for involuntary urination.  Musculoskeletal:  Positive for joint pain, joint pain, myalgias and myalgias. Negative for gait problem, joint swelling, muscle weakness, morning stiffness and muscle tenderness.  Skin:  Positive for rash and hair loss. Negative for color change and sensitivity to sunlight.  Allergic/Immunologic: Negative for susceptible to infections.  Neurological:  Negative for dizziness and headaches.  Hematological:  Negative for swollen glands.  Psychiatric/Behavioral:  Positive for sleep disturbance. Negative for depressed mood. The patient is not nervous/anxious.  PMFS History:  Patient Active Problem List   Diagnosis Date Noted   Positive ANA (antinuclear antibody) 01/03/2024   Patellofemoral pain syndrome of both knees 01/03/2024   Foot cramps 01/03/2024   Vitamin D  deficiency 12/08/2021   Anemia 12/08/2021   Iron  deficiency anemia 12/08/2021   IUD (intrauterine device) in place 03/24/2018   Abnormal vaginal  bleeding 03/24/2018    Past Medical History:  Diagnosis Date   Anemia     Family History  Problem Relation Age of Onset   Congestive Heart Failure Mother    Hypertension Mother    Lupus Mother    Anemia Mother    Breast cancer Maternal Grandmother    Hypertension Maternal Grandmother    History reviewed. No pertinent surgical history. Social History   Social History Narrative   Not on file   Immunization History  Administered Date(s) Administered   Tdap 10/02/2015     Objective: Vital Signs: BP 132/85 (BP Location: Right Arm, Patient Position: Sitting, Cuff Size: Normal)   Pulse (!) 59   Resp 14   Ht 5' 1 (1.549 m)   Wt 177 lb (80.3 kg)   LMP 01/02/2024   BMI 33.44 kg/m    Physical Exam  Eyes:     Conjunctiva/sclera: Conjunctivae normal.    Cardiovascular:     Rate and Rhythm: Normal rate and regular rhythm.  Pulmonary:     Effort: Pulmonary effort is normal.     Breath sounds: Normal breath sounds.  Lymphadenopathy:     Cervical: No cervical adenopathy.   Skin:    General: Skin is warm and dry.     Findings: Rash present.     Comments: Left wrist Normal appearing nailfold capillaries Few areas of partial hair thinning, no visible rash no scarring alopecia   Neurological:     Mental Status: She is alert.   Psychiatric:        Mood and Affect: Mood normal.      Musculoskeletal Exam:  Neck full ROM no tenderness Shoulders full ROM no tenderness or swelling Elbows mildly hyperextensible no tenderness or swelling Wrists full ROM no tenderness or swelling Fingers mildly hyperextensible no tenderness or swelling Hip normal internal and external rotation without pain, no tenderness to lateral hip palpation Knees no tenderness or swelling bilateral patellofemoral crepitus, increased lateral patellar mobility Ankles full ROM no tenderness or swelling    Investigation: No additional findings.  Imaging: No results found.  Recent Labs: Lab  Results  Component Value Date   WBC 5.5 10/11/2022   HGB 12.8 10/11/2022   PLT 394 10/11/2022   NA 138 01/03/2024   K 4.5 01/03/2024   CL 107 01/03/2024   CO2 26 01/03/2024   GLUCOSE 88 01/03/2024   BUN 10 01/03/2024   CREATININE 1.02 (H) 01/03/2024   BILITOT 0.5 12/24/2021   ALKPHOS 54 12/24/2021   AST 15 12/24/2021   ALT 14 12/24/2021   PROT 7.4 12/24/2021   ALBUMIN 3.4 (L) 12/24/2021   CALCIUM 9.4 01/03/2024   GFRAA >60 07/16/2017    Speciality Comments: No specialty comments available.  Procedures:  No procedures performed Allergies: Patient has no known allergies.   Assessment / Plan:     Visit Diagnoses: Positive ANA (antinuclear antibody) - Plan: Sedimentation rate, C3 and C4, Anti-Smith antibody, RNP Antibody, Sjogrens syndrome-A extractable nuclear antibody, Sjogrens syndrome-B extractable nuclear antibody, Anti-DNA antibody, double-stranded, Protein / creatinine ratio, urine Positive ANA test with low titer, possibly incidental. Differential includes lupus, Sjogren's  syndrome, and rheumatoid arthritis. No significant clinical signs of lupus observed currently. Family history of lupus noted. Discussed the non-specific nature of ANA and the need for more specific antibody tests to clarify diagnosis. - Order specific antibody tests including double-stranded DNA, Smith, SSA, SSB, RNP, complement C3 and C4. - Order sedimentation rate to assess inflammation. - Order urine protein creatinine ratio and metabolic panel to evaluate kidney function and proteinuria.  Patellofemoral pain syndrome of both knees Patellofemoral pain syndrome Chronic knee pain exacerbated by hypermobility. No evidence of severe arthritis or major structural damage. Strengthening quadriceps can help stabilize the knee and reduce pain. - Provided exercises to strengthen quadriceps and stabilize the knee joint.  Benign joint hypermobility syndrome Increased joint flexibility, particularly in knees,  leading to patellofemoral pain syndrome. Potential risk of early osteoarthritis if not managed with muscle strengthening. - Provided information on patellofemoral pain syndrome and exercises to strengthen quadriceps and stabilize the knee joint.  Foot cramps - Plan: Magnesium, Basic metabolic panel with GFR Vitamin D  deficiency - Plan: VITAMIN D  25 Hydroxy (Vit-D Deficiency, Fractures) Severe cramping in both feet, particularly in the toes, occurring mostly in the evening. Possible contribution from overuse or electrolyte imbalance, though the latter is less likely. - Recommend magnesium amylate supplement to alleviate cramping. - Check electrolytes, including magnesium and potassium levels.        Orders: Orders Placed This Encounter  Procedures   Sedimentation rate   C3 and C4   Anti-Smith antibody   RNP Antibody   Sjogrens syndrome-A extractable nuclear antibody   Sjogrens syndrome-B extractable nuclear antibody   Anti-DNA antibody, double-stranded   Protein / creatinine ratio, urine   Magnesium   Basic metabolic panel with GFR   VITAMIN D  25 Hydroxy (Vit-D Deficiency, Fractures)   No orders of the defined types were placed in this encounter.    Follow-Up Instructions: No follow-ups on file.   Lonni LELON Ester, MD  Note - This record has been created using [employer].  Chart creation errors have been sought, but may not always  have been located. Such creation errors do not reflect on  the standard of medical care.

## 2024-01-03 NOTE — Patient Instructions (Signed)
 Stretching and range-of-motion exercises These exercises warm up your muscles and joints and improve the movement and flexibility of your knee. These exercises also help to relieve pain and swelling. Quadriceps stretch, prone  Lie on your abdomen on a firm surface, such as a bed or padded floor. Bend your left / right knee and hold your ankle. If you cannot reach your ankle or pant leg, loop a belt around your foot and grab the belt instead. Gently pull your heel toward your buttocks. Your knee should not slide out to the side. You should feel a stretch in the front of your thigh and knee (quadriceps). Hold this position for __________ seconds. Repeat __________ times. Complete this exercise __________ times a day. Strengthening exercises These exercises build strength and endurance in your knee. Endurance is the ability to use your muscles for a long time, even after they get tired. Straight leg raises This exercise strengthens the muscles in front of your thigh (quadriceps) and the muscles that move your hips (hip flexors). Lie on your back with your left / right leg extended and your other knee bent. Tense the muscles in the front of your left / right thigh. You should see your kneecap slide up or see increased dimpling just above the knee. Your thigh may even shake a bit. Keep these muscles tight as you raise your leg 4-6 inches (10-15 cm) off the floor. Do not let your knee bend. Hold this position for __________ seconds. Keep these muscles tense as you lower your leg. Relax your muscles slowly and completely after each repetition. Repeat __________ times. Complete this exercise __________ times a day. Squats This exercise strengthens the muscles in front of your thigh and knee (quadriceps). Stand in front of a table, with your feet and knees pointing straight ahead. You may rest your hands on the table for balance but not for support. Slowly bend your knees and lower your hips like you  are going to sit in a chair. Keep your weight over your heels, not over your toes. Keep your lower legs upright so they are parallel with the table legs. Do not let your hips go lower than your knees. Do not bend lower than told by your health care provider. If your knee pain increases, do not bend as low. Hold the squat position for __________ seconds. Slowly push with your legs to return to standing. Do not use your hands to pull yourself to standing. Repeat __________ times. Complete this exercise __________ times a day. Wall slides This exercise strengthens the muscles in front of your thigh and knee (quadriceps). Lean your back against a smooth wall or door, and walk your feet out 18-24 inches (46-61 cm) from it. Place your feet hip-width apart. Slowly slide down the wall or door until your knees bend __________ degrees. Keep your knees over your heels, not over your toes. Keep your knees in line with your hips. Hold this position for __________ seconds. Repeat __________ times. Complete this exercise __________ times a day. Straight leg raises, side-lying This exercise strengthens the muscles that rotate the leg at the hip and move it away from your body (hip abductors). Lie on your side with your left / right leg in the top position. Lie so your head, shoulder, knee, and hip line up. You may bend your bottom knee to help you keep your balance. Roll your hips slightly forward so your hips are stacked directly over each other and your left / right knee  is facing forward. Leading with your heel, lift your top leg 4-6 inches (10-15 cm). You should feel the muscles in your outer hip lifting. Do not let your foot drift forward. Do not let your knee roll toward the ceiling. Hold this position for __________ seconds. Slowly return your leg to the starting position. Let your muscles relax completely after each repetition. Repeat __________ times. Complete this exercise __________ times a  day.

## 2024-01-05 LAB — C3 AND C4
C3 Complement: 135 mg/dL (ref 83–193)
C4 Complement: 29 mg/dL (ref 15–57)

## 2024-01-05 LAB — SJOGRENS SYNDROME-B EXTRACTABLE NUCLEAR ANTIBODY: SSB (La) (ENA) Antibody, IgG: <1 AI

## 2024-01-05 LAB — BASIC METABOLIC PANEL WITH GFR
BUN/Creatinine Ratio: 10 (calc) (ref 6–22)
BUN: 10 mg/dL (ref 7–25)
CO2: 26 mmol/L (ref 20–32)
Calcium: 9.4 mg/dL (ref 8.6–10.2)
Chloride: 107 mmol/L (ref 98–110)
Creat: 1.02 mg/dL — ABNORMAL HIGH (ref 0.50–0.97)
Glucose, Bld: 88 mg/dL (ref 65–99)
Potassium: 4.5 mmol/L (ref 3.5–5.3)
Sodium: 138 mmol/L (ref 135–146)
eGFR: 73 mL/min/{1.73_m2} (ref >=60)

## 2024-01-05 LAB — VITAMIN D 25 HYDROXY (VIT D DEFICIENCY, FRACTURES): Vit D, 25-Hydroxy: 29 ng/mL — ABNORMAL LOW (ref 30–100)

## 2024-01-05 LAB — PROTEIN / CREATININE RATIO, URINE
Creatinine, Urine: 369 mg/dL — ABNORMAL HIGH (ref 20–275)
Protein/Creat Ratio: 57 mg/g{creat} (ref 24–184)
Protein/Creatinine Ratio: 0.057 mg/mg{creat} (ref 0.024–0.184)
Total Protein, Urine: 21 mg/dL (ref 5–24)

## 2024-01-05 LAB — RNP ANTIBODY: Ribonucleic Protein(ENA) Antibody, IgG: <1 AI

## 2024-01-05 LAB — MAGNESIUM: Magnesium: 1.9 mg/dL (ref 1.5–2.5)

## 2024-01-05 LAB — ANTI-DNA ANTIBODY, DOUBLE-STRANDED: ds DNA Ab: <1 [IU]/mL

## 2024-01-05 LAB — SEDIMENTATION RATE: Sed Rate: 9 mm/h (ref 0–20)

## 2024-01-05 LAB — SJOGRENS SYNDROME-A EXTRACTABLE NUCLEAR ANTIBODY: SSA (Ro) (ENA) Antibody, IgG: <1 AI

## 2024-01-05 LAB — ANTI-SMITH ANTIBODY: ENA SM Ab Ser-aCnc: <1 AI

## 2024-01-17 ENCOUNTER — Telehealth: Payer: Self-pay

## 2024-01-17 NOTE — Telephone Encounter (Signed)
 Patient contacted the office and left a message regarding her lab results. Patient states she has received her results but needs help understanding them. Patient has an appointment made for six months from her new patient appointment. Please advise.

## 2024-01-19 ENCOUNTER — Telehealth: Payer: Self-pay | Admitting: Internal Medicine

## 2024-01-19 NOTE — Telephone Encounter (Signed)
 Pt called wanting Dr. Jeannetta to call her about her lab results. Pt stated this was a ridiculous amount of time to wait for a call about these results. Pt stated she will call back Monday.

## 2024-01-23 ENCOUNTER — Ambulatory Visit: Payer: Self-pay | Admitting: Internal Medicine

## 2024-01-23 DIAGNOSIS — M222X1 Patellofemoral disorders, right knee: Secondary | ICD-10-CM

## 2024-01-23 NOTE — Telephone Encounter (Signed)
Advised in result note

## 2024-01-23 NOTE — Progress Notes (Signed)
 The antibody tests for lupus or Sjogren's syndrome- double-stranded ENA, RNP, SSA, SSB, and Smith- were all negative.  Sedimentation rate and complements were normal so did not show systemic inflammation.  The metabolic panel, magnesium, and urine protein/creatinine ratio is normal so would not indicate any type of kidney disease or electrolyte problem.  Vitamin D  was mildly deficient at 29 I do not think she needs a prescription but should add a daily OTC supplement with 1000 units, or increase her current supplement by 1000 units if already taking any.  Overall her results look very reassuring and I am not sure if the positive ANA is truly indicating any disease.  We could just monitor this and consider reevaluating again after several months or if new symptoms develop.  Regarding the joint pain and hypermobility especially in her knees if she wants we could refer to sports medicine specialist for it.

## 2024-01-23 NOTE — Telephone Encounter (Signed)
**Note De-identified  [place] Obfuscation** Please advise 

## 2024-01-23 NOTE — Telephone Encounter (Signed)
 Sorry for the delay in getting back to her about results.  These are now addressed in the associated result note.

## 2024-02-08 DIAGNOSIS — Z01411 Encounter for gynecological examination (general) (routine) with abnormal findings: Secondary | ICD-10-CM | POA: Diagnosis not present

## 2024-02-08 DIAGNOSIS — E559 Vitamin D deficiency, unspecified: Secondary | ICD-10-CM | POA: Diagnosis not present

## 2024-02-08 DIAGNOSIS — Z13228 Encounter for screening for other metabolic disorders: Secondary | ICD-10-CM | POA: Diagnosis not present

## 2024-02-08 DIAGNOSIS — Z1389 Encounter for screening for other disorder: Secondary | ICD-10-CM | POA: Diagnosis not present

## 2024-02-08 DIAGNOSIS — I10 Essential (primary) hypertension: Secondary | ICD-10-CM | POA: Diagnosis not present

## 2024-02-08 DIAGNOSIS — R5382 Chronic fatigue, unspecified: Secondary | ICD-10-CM | POA: Diagnosis not present

## 2024-02-08 DIAGNOSIS — R102 Pelvic and perineal pain: Secondary | ICD-10-CM | POA: Diagnosis not present

## 2024-02-08 DIAGNOSIS — Z1321 Encounter for screening for nutritional disorder: Secondary | ICD-10-CM | POA: Diagnosis not present

## 2024-02-08 DIAGNOSIS — Z130 Encounter for screening for diseases of the blood and blood-forming organs and certain disorders involving the immune mechanism: Secondary | ICD-10-CM | POA: Diagnosis not present

## 2024-02-08 DIAGNOSIS — Z13220 Encounter for screening for lipoid disorders: Secondary | ICD-10-CM | POA: Diagnosis not present

## 2024-02-21 DIAGNOSIS — Z13228 Encounter for screening for other metabolic disorders: Secondary | ICD-10-CM | POA: Diagnosis not present

## 2024-02-21 DIAGNOSIS — Z1321 Encounter for screening for nutritional disorder: Secondary | ICD-10-CM | POA: Diagnosis not present

## 2024-02-21 DIAGNOSIS — Z13220 Encounter for screening for lipoid disorders: Secondary | ICD-10-CM | POA: Diagnosis not present

## 2024-02-21 DIAGNOSIS — E559 Vitamin D deficiency, unspecified: Secondary | ICD-10-CM | POA: Diagnosis not present

## 2024-02-21 DIAGNOSIS — Z130 Encounter for screening for diseases of the blood and blood-forming organs and certain disorders involving the immune mechanism: Secondary | ICD-10-CM | POA: Diagnosis not present

## 2024-02-21 DIAGNOSIS — R5382 Chronic fatigue, unspecified: Secondary | ICD-10-CM | POA: Diagnosis not present

## 2024-02-29 NOTE — Addendum Note (Signed)
 Addended by: JEANNETTA LONNI ORN on: 02/29/2024 03:38 AM   Modules accepted: Orders

## 2024-07-02 ENCOUNTER — Ambulatory Visit: Admitting: Internal Medicine

## 2024-07-23 ENCOUNTER — Emergency Department (HOSPITAL_BASED_OUTPATIENT_CLINIC_OR_DEPARTMENT_OTHER): Admission: EM | Admit: 2024-07-23 | Discharge: 2024-07-23 | Disposition: A | Discharge status: Home / Self Care

## 2024-07-23 ENCOUNTER — Emergency Department (HOSPITAL_BASED_OUTPATIENT_CLINIC_OR_DEPARTMENT_OTHER)

## 2024-07-23 ENCOUNTER — Encounter: Payer: Self-pay | Admitting: Oncology

## 2024-07-23 ENCOUNTER — Other Ambulatory Visit: Payer: Self-pay

## 2024-07-23 ENCOUNTER — Ambulatory Visit: Payer: Self-pay

## 2024-07-23 ENCOUNTER — Encounter (HOSPITAL_BASED_OUTPATIENT_CLINIC_OR_DEPARTMENT_OTHER): Payer: Self-pay

## 2024-07-23 DIAGNOSIS — R030 Elevated blood-pressure reading, without diagnosis of hypertension: Secondary | ICD-10-CM | POA: Insufficient documentation

## 2024-07-23 DIAGNOSIS — R079 Chest pain, unspecified: Secondary | ICD-10-CM | POA: Diagnosis present

## 2024-07-23 DIAGNOSIS — R0789 Other chest pain: Secondary | ICD-10-CM | POA: Diagnosis not present

## 2024-07-23 LAB — BASIC METABOLIC PANEL WITH GFR
Anion gap: 11 (ref 5–15)
BUN: 13 mg/dL (ref 6–20)
CO2: 23 mmol/L (ref 22–32)
Calcium: 9.3 mg/dL (ref 8.9–10.3)
Chloride: 104 mmol/L (ref 98–111)
Creatinine, Ser: 0.95 mg/dL (ref 0.44–1.00)
GFR, Estimated: >60 mL/min
Glucose, Bld: 88 mg/dL (ref 70–99)
Potassium: 3.8 mmol/L (ref 3.5–5.1)
Sodium: 138 mmol/L (ref 135–145)

## 2024-07-23 LAB — CBC
HCT: 32.9 % — ABNORMAL LOW (ref 36.0–46.0)
Hemoglobin: 10.7 g/dL — ABNORMAL LOW (ref 12.0–15.0)
MCH: 25.8 pg — ABNORMAL LOW (ref 26.0–34.0)
MCHC: 32.5 g/dL (ref 30.0–36.0)
MCV: 79.5 fL — ABNORMAL LOW (ref 80.0–100.0)
Platelets: 359 K/uL (ref 150–400)
RBC: 4.14 MIL/uL (ref 3.87–5.11)
RDW: 16.9 % — ABNORMAL HIGH (ref 11.5–15.5)
WBC: 4.9 K/uL (ref 4.0–10.5)
nRBC: 0 % (ref 0.0–0.2)

## 2024-07-23 LAB — TROPONIN T, HIGH SENSITIVITY: Troponin T High Sensitivity: <15 ng/L (ref 0–19)

## 2024-07-23 LAB — PREGNANCY, URINE: Preg Test, Ur: NEGATIVE

## 2024-07-23 MED ORDER — IOHEXOL 350 MG/ML SOLN
100.0000 mL | Freq: Once | INTRAVENOUS | Status: AC | PRN
  Administered 2024-07-23: 75 mL via INTRAVENOUS

## 2024-07-23 NOTE — ED Triage Notes (Signed)
 Pt states that she is having some chest pain and her BP has been elevated. Pt states that she is also having some pain right leg calf is also hurting.States that over the weekend she was having headaches and bilateral leg pain x 1 week.

## 2024-07-23 NOTE — ED Notes (Signed)
 ED Provider at bedside.

## 2024-07-23 NOTE — ED Provider Notes (Signed)
 " [facility] Provider Note   CSN: [id number] Arrival date & time: 07/23/24  1158     Patient presents with: Chest Pain   Lindsey Zamora is a 39 y.o. female.   39 year old female presents today for concern of chest pain, occasional shortness of breath and blood pressure being elevated.  Endorses polyarthralgia but this is not new.  Per chart review previously seen at rheumatology clinic that the workup was overall reassuring and they did not feel she had a rheumatological condition because the systemic inflammatory markers were normal.  No balance issues or vision change.  No weakness, speech change.  The history is provided by the patient. No language interpreter was used.       Prior to Admission medications  Medication Sig Start Date End Date Taking? Authorizing Provider  cyanocobalamin  1000 MCG tablet Take 1,000 mcg by mouth daily. Patient not taking: Reported on 01/03/2024    [provider]  Prenatal Vit-Fe Fumarate-FA (PRENATAL VITAMINS PO) Take by mouth.    [provider]    Allergies: Patient has no known allergies.    Review of Systems  Updated Vital Signs BP (!) 156/106   Pulse 60   Temp 98.4 F (36.9 C) (Oral)   Resp 18   Ht 5' 1 (1.549 m)   Wt 83.9 kg   LMP 07/22/2024   SpO2 100%   BMI 34.96 kg/m   Physical Exam  (all labs ordered are listed, but only abnormal results are displayed) Labs Reviewed  CBC - Abnormal; Notable for the following components:      Result Value   Hemoglobin 10.7 (*)    HCT 32.9 (*)    MCV 79.5 (*)    MCH 25.8 (*)    RDW 16.9 (*)    All other components within normal limits  BASIC METABOLIC PANEL WITH GFR  PREGNANCY, URINE  TROPONIN T, HIGH SENSITIVITY  TROPONIN T, HIGH SENSITIVITY    EKG: EKG Interpretation Date/Time:  Monday July 23 2024 12:15:09 EST Ventricular Rate:  63 PR Interval:  159 QRS Duration:  99 QT Interval:  438 QTC Calculation: 449 R  Axis:   31  Text Interpretation: Sinus rhythm Low voltage, precordial leads Confirmed by Neysa Clap [phone]) on 07/23/2024 2:07:53 PM  Radiology: CT Angio Chest PE W and/or Wo Contrast Result Date: 07/23/2024 EXAM: CTA CHEST 07/23/2024 02:12:33 PM TECHNIQUE: CTA of the chest was performed after the administration of intravenous contrast. Multiplanar reformatted images are provided for review. MIP images are provided for review. Automated exposure control, iterative reconstruction, and/or weight based adjustment of the mA/kV was utilized to reduce the radiation dose to as low as reasonably achievable. COMPARISON: None available. CLINICAL HISTORY: Pulmonary embolism (PE) suspected, high prob. FINDINGS: PULMONARY ARTERIES: Pulmonary arteries are adequately opacified for evaluation. No acute pulmonary embolus. Main pulmonary artery is normal in caliber. MEDIASTINUM: The heart and pericardium demonstrate no acute abnormality. There is no acute abnormality of the thoracic aorta. The thoracic aorta is normal in caliber. LYMPH NODES: No mediastinal, hilar or axillary lymphadenopathy. LUNGS AND PLEURA: The lungs are without acute process. No focal consolidation or pulmonary edema. No evidence of pleural effusion or pneumothorax. UPPER ABDOMEN: Limited images of the upper abdomen demonstrate a tiny hiatal hernia. SOFT TISSUES AND BONES: No acute bone or soft tissue abnormality. IMPRESSION: 1. No pulmonary embolism. 2. Tiny hiatal hernia. Electronically signed by: Morgane Naveau MD 07/23/2024 02:46 PM EST RP Workstation: [id number]  DG Chest 2 View Result Date: 07/23/2024 CLINICAL DATA:  Acute chest pain, shortness of breath and hypertension EXAM: CHEST - 2 VIEW COMPARISON:  None Available. FINDINGS: The heart size and mediastinal contours are within normal limits. Both lungs are clear. The visualized skeletal structures are unremarkable. IMPRESSION: No active cardiopulmonary disease. Electronically Signed   By: CHRISTELLA.   Shick M.D.   On: 07/23/2024 13:55     Procedures   Medications Ordered in the ED  iohexol  (OMNIPAQUE ) 350 MG/ML injection 100 mL (75 mLs Intravenous Contrast Given 07/23/24 1358)                                    Medical Decision Making Amount and/or Complexity of Data Reviewed Labs: ordered. Radiology: ordered.  Risk Prescription drug management.   Medical Decision Making / ED Course   This patient presents to the ED for concern of chest pain, shortness of breath, this involves an extensive number of treatment options, and is a complaint that carries with it a high risk of complications and morbidity.  The differential diagnosis includes ACS, PE, pneumonia, MSK pain  MDM: 39 year old female presents today for concern of elevated blood pressure, chest pain, occasional shortness of breath. Overall she is well-appearing.  Hypertensive but otherwise hemodynamically stable. Currently not on any blood pressure medication.  Previously on it but she discontinued the amlodipine she was prescribed because of the side effects and currently does not have a PCP.  Will give her a referral.  Discussed blood pressure diary.  CBC without acute concern.  Slight decline in her hemoglobin given MCV is low could be iron  deficiency.  Hemodynamically stable and no indication of acute GI bleed.  Discussed to have this monitored by her PCP.  Troponin normal.  No leukocytosis.  BMP unremarkable.  Pregnancy test negative.  CT angio chest PE study is normal.  Chest x-ray without acute concern.  EKG without acute ischemic change.  Patient without any concerning source of etiology for symptoms.  Discussed follow-up with the internal medicine center. We discussed f sports medicine follow-up for her polyarthralgias which was also noted by the rheumatologist but she declines this at this time.     Additional history obtained: -Additional history obtained from chart review particularly rheumatology  notes -External records from outside source obtained and reviewed including: Chart review including previous notes, labs, imaging, consultation notes   Lab Tests: -I ordered, reviewed, and interpreted labs.   The pertinent results include:   Labs Reviewed  CBC - Abnormal; Notable for the following components:      Result Value   Hemoglobin 10.7 (*)    HCT 32.9 (*)    MCV 79.5 (*)    MCH 25.8 (*)    RDW 16.9 (*)    All other components within normal limits  BASIC METABOLIC PANEL WITH GFR  PREGNANCY, URINE  TROPONIN T, HIGH SENSITIVITY  TROPONIN T, HIGH SENSITIVITY      EKG  EKG Interpretation Date/Time:  Monday July 23 2024 12:15:09 EST Ventricular Rate:  63 PR Interval:  159 QRS Duration:  99 QT Interval:  438 QTC Calculation: 449 R Axis:   31  Text Interpretation: Sinus rhythm Low voltage, precordial leads Confirmed by Neysa Clap [phone]) on 07/23/2024 2:07:53 PM         Imaging Studies ordered: I ordered imaging studies including chest x-ray, CT angio chest PE study I independently visualized  and interpreted imaging. I agree with the radiologist interpretation   Medicines ordered and prescription drug management: Meds ordered this encounter  Medications   iohexol  (OMNIPAQUE ) 350 MG/ML injection 100 mL    -I have reviewed the patients home medicines and have made adjustments as needed  Social Determinants of Health:  Factors impacting patients care include: Does not have PCP   Reevaluation: After the interventions noted above, I reevaluated the patient and found that they have :stayed the same  Co morbidities that complicate the patient evaluation  Past Medical History:  Diagnosis Date   Anemia       Dispostion: Discharged in stable condition.  Return precaution discussed.  Patient voices understanding and is in agreement with plan.   Final diagnoses:  Atypical chest pain  Elevated blood pressure reading    ED Discharge Orders      None          Hildegard Loge, PA-C 07/23/24 1607    Neysa Caron PARAS, DO 07/26/24 0700  "

## 2024-07-23 NOTE — Telephone Encounter (Signed)
 call disconnected call prior to warm transfer. Nurse will attempt to call patient back.

## 2024-07-23 NOTE — Telephone Encounter (Signed)
 Copied from CRM [id number]. Topic: Clinical - Red Word Triage >> Jul 23, 2024 10:17 AM Delon HERO wrote: Red Word that prompted transfer to Nurse Triage: Patient is calling to report elevated blood pressure since November. Reporting reading 06/04/2024- 157/98, 06/05/2024- 151/88, 11/16-131/98. Reporting symptoms of right side calf pain , and Shortness of Breath. Report genetic high blood pressure, Reporting that she was on high blood pressure medication over a month ago. However, she was retaining too much fluid. Patient has no pcp- was trying to establish with [facility]. However, First available appointment is March 2026.  Primary Care [facility] First available 07/24/2024. Reason for Disposition  [1] Systolic BP >= 160 OR Diastolic >= 100 AND [2] cardiac (e.g., breathing difficulty, chest pain) or neurologic symptoms (e.g., new-onset blurred or double vision, unsteady gait)  Answer Assessment - Initial Assessment Questions Advised ED now.  Advised 911 if symptoms worsen: severe diff breathing, faint, chest pain >5 mins. Patient verbalized understanding.  Patient will call back to establish care.  1. BLOOD PRESSURE: What is your blood pressure? Did you take at least two measurements 5 minutes apart?     BP 155/109/ HR 59 2. ONSET: When did you take your blood pressure?     today 3. HOW: How did you take your blood pressure? (e.g., automatic home BP monitor, visiting nurse)     Auto bp arm 4. HISTORY: Do you have a history of high blood pressure?     no 5. MEDICINES: Are you taking any medicines for blood pressure? Have you missed any doses recently?     no 6. OTHER SYMPTOMS: Do you have any symptoms? (e.g., blurred vision, chest pain, difficulty breathing, headache, weakness) Denies diff breathing, weakness, numbness, vision problems Reports intermittent chest pain, not currently  Right calf muscle pain; swelling, constant pain, 7/10;denies redness, hot to touch Sob with  exertion  Protocols used: Blood Pressure - High-A-AH

## 2024-07-23 NOTE — Discharge Instructions (Signed)
 Follow-up with the internal medicine clinic as listed above for you.  Keep a blood pressure diary until you follow-up with them.  Return for any emergent symptoms such as worsening chest pain, shortness of breath, balance issues, or vision change.  No concerning findings on your work up today on the chest x-ray, CT scan, or blood work.

## 2024-07-24 NOTE — Telephone Encounter (Signed)
 Patient is currently sch with the following office:  Encounter Information  Provider Department Encounter # Center  10/11/2024 2:40 PM Georgina Speaks, FNP [facility] [phone]      Copied from CRM [id number]. Topic: Appointments - Scheduling Inquiry for Clinic >> Jul 23, 2024  4:23 PM DeAngela L wrote: Reason for CRM: patient received a referral to become a new patient from [facility] med center and she would like to become a new pt   Pt num  [phone]

## 2024-08-10 NOTE — Telephone Encounter (Signed)
 Spoke with the patient:  Name: Lindsey Zamora, Lindsey Zamora MRN: [id number]  Date: 08/22/2024 Status: Sch  Time: 3:15 PM Length: 60  Visit Type: NEW PT - OFFICE VISIT [8001] Copay: $25.00  Provider: Tobie Gaines, DO       Copied from CRM [id number]. Topic: Appointments - Scheduling Inquiry for Clinic >> Aug 09, 2024 12:16 PM Chiquita SQUIBB wrote: Reason for CRM: Patient is calling in stating that when she was seen in the ED the doctor stated he was going to be putting in a referral for the patient to be seen at [facility]. Patient is calling in to see if this referral has been received and if she can still be seen with a referral due to the office not accepting new patients at this time. Patient states she has called several time with no response. Please advise patient.

## 2024-08-22 ENCOUNTER — Ambulatory Visit: Payer: Self-pay | Admitting: Student

## 2024-08-22 ENCOUNTER — Other Ambulatory Visit: Payer: Self-pay

## 2024-08-22 ENCOUNTER — Encounter: Payer: Self-pay | Admitting: Student

## 2024-08-22 VITALS — BP 133/89 | HR 68 | Temp 97.9°F | Ht 61.5 in | Wt 186.6 lb

## 2024-08-22 DIAGNOSIS — K449 Diaphragmatic hernia without obstruction or gangrene: Secondary | ICD-10-CM | POA: Insufficient documentation

## 2024-08-22 DIAGNOSIS — G8929 Other chronic pain: Secondary | ICD-10-CM | POA: Insufficient documentation

## 2024-08-22 DIAGNOSIS — H6120 Impacted cerumen, unspecified ear: Secondary | ICD-10-CM

## 2024-08-22 DIAGNOSIS — I10 Essential (primary) hypertension: Secondary | ICD-10-CM | POA: Insufficient documentation

## 2024-08-22 DIAGNOSIS — D508 Other iron deficiency anemias: Secondary | ICD-10-CM

## 2024-08-22 DIAGNOSIS — N939 Abnormal uterine and vaginal bleeding, unspecified: Secondary | ICD-10-CM

## 2024-08-22 DIAGNOSIS — Z1231 Encounter for screening mammogram for malignant neoplasm of breast: Secondary | ICD-10-CM

## 2024-08-22 DIAGNOSIS — Z1159 Encounter for screening for other viral diseases: Secondary | ICD-10-CM

## 2024-08-22 NOTE — Assessment & Plan Note (Signed)
 Patient has a past medical history of abnormal vaginal bleeding.  Has been previously worked up.  [facility]  Patient was written for birth control, but has stopped taking this.  This abnormal vaginal bleeding was likely in the setting of adenomyosis or fibroids.  Patient had pelvic ultrasound in 2018 showing 3.1 cm left fundal intramural fibroid as well as diffuse uterine adenomyosis and 1.8 cm right ovarian cyst.  These are likely reasons of her abnormal vaginal bleeding.  Patient has been lost to follow-up to GYN.  I instructed her to follow-up with her GYN.  Plan: - Instructed patient to follow-up with GYN - Likely related to patient's pain and iron  deficiency anemia

## 2024-08-22 NOTE — Assessment & Plan Note (Signed)
 Incidentally found to have hiatal hernia on CT imaging on 07/23/24.  Patient does have some acid reflux which is likely contributing.  Offered PPI today, which patient politely declined.  No surgical indication at this time.  No endoscopic indication at this time.  Will monitor clinically.

## 2024-08-22 NOTE — Patient Instructions (Addendum)
 Lindsey Zamora, Thank you for allowing me to take part in your care today.  Here are your instructions.  1.  Regarding your high blood pressure, there are 3 options you can take.  Losartan, amlodipine, hydrochlorothiazide.  Please research these and let me know which one you want to take.  2.  Please reach out to your GYN to schedule an appointment to be evaluated.  Get your Pap smear with them as well  3.  We have cleaned out your ears today.  4.  For your pain, I do wonder if this is related to your abnormal vaginal bleeding, but I do think this could also be related to potential fibromyalgia.  Please use Voltaren gel (diclofenac gel) which can be found over-the-counter.  5.  Please come back in 2 months to schedule an appointment with me to see how you are doing.  6.  I will call you with the results of your labs.  I am checking you for hepatitis C and checking your iron  levels   7. Start taking iron  with your prenatal vitamins    PLEASE BRING YOUR MEDICATIONS TO EVERY APPOINTMENT  Thank you, Dr. Tobie  If you have any other questions please contact the internal medicine clinic at [phone] If it is after hours, please call the [place] [facility] at [phone] and then ask the person who picks up for the resident on call.

## 2024-08-22 NOTE — Progress Notes (Signed)
 "  CC: New patient visit, establish care  HPI:  [name] is a 39 y.o. female with past medical history of anemia, iron  deficiency, vitamin D  deficiency, patellofemoral pain syndrome who presents to establish care.  Please see assessment and plan for full HPI.   Medical history: Iron  deficiency anemia, chronic body pain (bilaterally in both legs), hiatal hernia  Medications Prenatal vitamin Ashwaganda Gummies Daily   Social: Lives with: [place], [place], [place] house, live with 2 children (18 and 11)  Occupation: Clinical Team lead at [employer]: Some support in the community, good support in therapy ( Support in change)  Level of function: Independent in all ADLs and iADLs  Substance: Wine occasionally  Not sexually active   Surgical history:  2 children vaginal birth, 3 D and C   Family history: Mom: Lupus, CHF, Neuropathy, Anemia  Dad: Unsure Maternal Grandmother: Breast Cancer, HTN, Anemia Maternal Grandfather: Unsure  Brother: N/A   2nd oldest Sister: N/A 3rd oldest Sister: Iron  Deficiency anemia  Youngest Sister: Brain hemorrhage     Past Medical History:  Diagnosis Date   Anemia     Current Medications[1]  Review of Systems:    MSK: Patient endorses chronic knee pain and body aches  Physical Exam:  Vitals:   08/22/24 1517 08/22/24 1522  BP: (!) 143/95 133/89  Pulse: 75 68  Temp: 97.9 F (36.6 C)   TempSrc: Oral   SpO2: 100%   Weight: 186 lb 9.6 oz (84.6 kg)   Height: 5' 1.5 (1.562 m)    General: Patient is sitting comfortably in the room  Head: Normocephalic, atraumatic  Ears: Cerumen noted in bilateral ear canals Cardio: Regular rate and rhythm, no murmurs, rubs or gallops Pulmonary: Clear to ausculation bilaterally with no rales, rhonchi, and crackles  Abdomen: Soft, nontender with normoactive bowel sounds with no rebound or guarding  MSK: 5/5 strength to upper and lower extremities.,  No tenderness to bilateral upper and lower  extremities.  Assessment & Plan:   Assessment & Plan Need for hepatitis C screening test Patient agrees to hepatitis C screening today.  Will order.  Plan: - Follow-up hepatitis C screen Other iron  deficiency anemia Patient has a past medical history of iron  deficiency anemia.  This is likely in the setting of chronic abnormal vaginal bleeding.  Most recent iron  studies on 10/11/2022 showing iron  70, TIBC 47, ferritin 10.  She has previously been followed by hematology Dr. Melanee who gave patient iron  infusions.  Patient has not had an iron  infusion in quite some time.  She is on prenatal vitamins at this time.  She states she has been having abnormal vaginal bleeding that is on and off.  Previously told that she may have had uterine fibroids which could be contributing.  Most recent CBC about 1 month ago showing hemoglobin of 10.7 and platelets 359.  No bleeding at this time.  Will evaluate iron  studies today.  Plan: - Iron  supplementation recommended - Follow-up iron , TIBC, ferritin - Follow-up CBC - If needed, will order iron  infusions Primary hypertension Patient has a past medical history of hypertension.  Previously has been treated with HCTZ 12.5 mg daily and amlodipine 5 mg daily.  Blood pressure today is 133/89.  Patient has been keeping a log which had showed consistent systolics of 140-150s.  Patient has not been taking any medications.  She states one of them made her feel bad.  She is not wanting to start medicine at this  time until she does more research.  Discussed losartan, amlodipine, HCTZ with patient in detail today.  She states she will try to do more research and decide which medicine would be best for her.  Will have patient call back to discuss which medicine she thinks she would like to start.  Plan: - Shared decision making about starting amlodipine versus losartan versus HCTZ - Patient to call back to discuss her research Cerumen in auditory canal on  examination Incidentally found to have cerumen in both auditory canals today.  RN in office cleaned out both ear canals.  After washout, I personally visualized the ear canal which looked much more clean. Encounter for screening mammogram for malignant neoplasm of breast Patient has previously been recommended for mammogram which was last done in August 2024.  At that time they recommended screening in 1 year.  Patient agrees to screening at this time.  Patient does have family history positive in maternal grandmother of breast cancer.  Plan: - Referred for mammogram Hiatal hernia Incidentally found to have hiatal hernia on CT imaging on 07/23/24.  Patient does have some acid reflux which is likely contributing.  Offered PPI today, which patient politely declined.  No surgical indication at this time.  No endoscopic indication at this time.  Will monitor clinically. Abnormal vaginal bleeding Patient has a past medical history of abnormal vaginal bleeding.  Has been previously worked up.  [facility]  Patient was written for birth control, but has stopped taking this.  This abnormal vaginal bleeding was likely in the setting of adenomyosis or fibroids.  Patient had pelvic ultrasound in 2018 showing 3.1 cm left fundal intramural fibroid as well as diffuse uterine adenomyosis and 1.8 cm right ovarian cyst.  These are likely reasons of her abnormal vaginal bleeding.  Patient has been lost to follow-up to GYN.  I instructed her to follow-up with her GYN.  Plan: - Instructed patient to follow-up with GYN - Likely related to patient's pain and iron  deficiency anemia Chronic pain of both knees Patient has a past medical history of chronic pain in her bilateral elbows, and her bilateral knees.  She states this has been going on for at least 20 years.  She states the pain gets worse with her menstrual cycles.  She however states that her pain is also present when she does not have  her menstrual cycles.  She has been worked up previously by rheumatology for concern for positive ANA as patient's mother had lupus.  Workup was unremarkable.  No concern for autoimmune disease.  Given bilateral pain, less likely OA.  Do not think this is related to gout or CPPD.  On exam, no obvious tenderness.  No other significant findings appreciated.  Do wonder if this is related to fibromyalgia, however no tenderness on exam.  Will have patient work with physical therapy.  Did offer Cymbalta today, but patient politely declined.  Plan: - Voltaren gel - Referred to physical therapy - Follow-up in 5 weeks   Patient discussed with Dr. Shawn Libby Blanch, DO Internal Medicine Resident PGY-3     [1]  Current Outpatient Medications:    Prenatal Vit-Fe Fumarate-FA (PRENATAL VITAMINS PO), Take by mouth., Disp: , Rfl:   "

## 2024-08-22 NOTE — Assessment & Plan Note (Signed)
 Patient has a past medical history of hypertension.  Previously has been treated with HCTZ 12.5 mg daily and amlodipine 5 mg daily.  Blood pressure today is 133/89.  Patient has been keeping a log which had showed consistent systolics of 140-150s.  Patient has not been taking any medications.  She states one of them made her feel bad.  She is not wanting to start medicine at this time until she does more research.  Discussed losartan, amlodipine, HCTZ with patient in detail today.  She states she will try to do more research and decide which medicine would be best for her.  Will have patient call back to discuss which medicine she thinks she would like to start.  Plan: - Shared decision making about starting amlodipine versus losartan versus HCTZ - Patient to call back to discuss her research

## 2024-08-22 NOTE — Assessment & Plan Note (Signed)
 Patient has a past medical history of chronic pain in her bilateral elbows, and her bilateral knees.  She states this has been going on for at least 20 years.  She states the pain gets worse with her menstrual cycles.  She however states that her pain is also present when she does not have her menstrual cycles.  She has been worked up previously by rheumatology for concern for positive ANA as patient's mother had lupus.  Workup was unremarkable.  No concern for autoimmune disease.  Given bilateral pain, less likely OA.  Do not think this is related to gout or CPPD.  On exam, no obvious tenderness.  No other significant findings appreciated.  Do wonder if this is related to fibromyalgia, however no tenderness on exam.  Will have patient work with physical therapy.  Did offer Cymbalta today, but patient politely declined.  Plan: - Voltaren gel - Referred to physical therapy - Follow-up in 5 weeks

## 2024-08-22 NOTE — Progress Notes (Signed)
 Internal Medicine Clinic Attending  Case discussed with the resident at the time of the visit.  We reviewed the resident's history and exam and pertinent patient test results.  I agree with the assessment, diagnosis, and plan of care documented in the resident's note.

## 2024-08-22 NOTE — Assessment & Plan Note (Signed)
 Patient has a past medical history of iron  deficiency anemia.  This is likely in the setting of chronic abnormal vaginal bleeding.  Most recent iron  studies on 10/11/2022 showing iron  70, TIBC 47, ferritin 10.  She has previously been followed by hematology Dr. Melanee who gave patient iron  infusions.  Patient has not had an iron  infusion in quite some time.  She is on prenatal vitamins at this time.  She states she has been having abnormal vaginal bleeding that is on and off.  Previously told that she may have had uterine fibroids which could be contributing.  Most recent CBC about 1 month ago showing hemoglobin of 10.7 and platelets 359.  No bleeding at this time.  Will evaluate iron  studies today.  Plan: - Iron  supplementation recommended - Follow-up iron , TIBC, ferritin - Follow-up CBC - If needed, will order iron  infusions

## 2024-08-23 ENCOUNTER — Other Ambulatory Visit: Payer: Self-pay

## 2024-08-23 ENCOUNTER — Other Ambulatory Visit (HOSPITAL_BASED_OUTPATIENT_CLINIC_OR_DEPARTMENT_OTHER): Payer: Self-pay

## 2024-08-23 ENCOUNTER — Ambulatory Visit: Payer: Self-pay | Admitting: Student

## 2024-08-23 ENCOUNTER — Encounter: Payer: Self-pay | Admitting: Oncology

## 2024-08-23 LAB — CBC
Hematocrit: 34.6 % (ref 34.0–46.6)
Hemoglobin: 11 g/dL — ABNORMAL LOW (ref 11.1–15.9)
MCH: 25.5 pg — ABNORMAL LOW (ref 26.6–33.0)
MCHC: 31.8 g/dL (ref 31.5–35.7)
MCV: 80 fL (ref 79–97)
Platelets: 420 10*3/uL (ref 150–450)
RBC: 4.31 x10E6/uL (ref 3.77–5.28)
RDW: 15.5 % — ABNORMAL HIGH (ref 11.7–15.4)
WBC: 5 10*3/uL (ref 3.4–10.8)

## 2024-08-23 LAB — HCV INTERPRETATION

## 2024-08-23 LAB — HCV AB W REFLEX TO QUANT PCR: HCV Ab: NONREACTIVE

## 2024-08-23 LAB — IRON AND TIBC
Iron Saturation: 6 % — CL (ref 15–55)
Iron: 26 ug/dL — ABNORMAL LOW (ref 27–159)
Total Iron Binding Capacity: 451 ug/dL — ABNORMAL HIGH (ref 250–450)
UIBC: 425 ug/dL (ref 131–425)

## 2024-08-23 MED ORDER — FERROUS SULFATE 325 (65 FE) MG PO TBEC
325.0000 mg | DELAYED_RELEASE_TABLET | Freq: Every day | ORAL | 3 refills | Status: AC
  Filled 2024-08-23: qty 90, 90d supply, fill #0

## 2024-08-24 ENCOUNTER — Telehealth (HOSPITAL_COMMUNITY): Payer: Self-pay | Admitting: Internal Medicine

## 2024-08-24 ENCOUNTER — Telehealth (HOSPITAL_COMMUNITY): Payer: Self-pay | Admitting: Pharmacy Technician

## 2024-08-24 ENCOUNTER — Other Ambulatory Visit: Payer: Self-pay | Admitting: Student

## 2024-08-24 DIAGNOSIS — D500 Iron deficiency anemia secondary to blood loss (chronic): Secondary | ICD-10-CM | POA: Insufficient documentation

## 2024-08-24 LAB — SPECIMEN STATUS REPORT

## 2024-08-24 LAB — FERRITIN: Ferritin: 11 ng/mL — ABNORMAL LOW (ref 15–150)

## 2024-08-24 NOTE — Telephone Encounter (Signed)
 Patient referred to infusion pharmacy team for ambulatory infusion of IV iron .  Insurance - [employer] of care - Site of care: [facility] Dx code - D50.0 IV Iron  Therapy - Venofer  300 mg x 3 doses  Infusion appointments - Scheduling team will schedule patient as soon as possible.   Thank you,  Norton Blush, PharmD, [facility]

## 2024-08-24 NOTE — Telephone Encounter (Signed)
 Auth Submission: NO AUTH NEEDED Site of care: [facility] Payer: [employer] [place] [employer] Medication & CPT/J Code(s) submitted: Venofer  (Iron  Sucrose) J1756 Diagnosis Code: D50.0 Route of submission (phone, fax, portal):  Phone # Fax # Auth type: Buy/Bill HB Units/visits requested: 300MG  X 3 DOSES Reference number:  Approval from: 08/24/2024 to 11/21/24    Dagoberto Armour, CPhT [facility] Phone: [phone] 08/24/2024

## 2024-08-29 ENCOUNTER — Ambulatory Visit

## 2024-10-11 ENCOUNTER — Ambulatory Visit: Payer: Self-pay | Admitting: Nurse Practitioner
